# Patient Record
Sex: Female | Born: 1955 | Race: White | Hispanic: No | Smoking: Former smoker
Health system: Southern US, Community
[De-identification: ages and names within clinical notes are randomized; demographics above are authoritative.]

## PROBLEM LIST (undated history)

## (undated) DIAGNOSIS — E8801 Alpha-1-antitrypsin deficiency: Secondary | ICD-10-CM

## (undated) DIAGNOSIS — Z9889 Other specified postprocedural states: Secondary | ICD-10-CM

## (undated) DIAGNOSIS — I700 Atherosclerosis of aorta: Secondary | ICD-10-CM

## (undated) DIAGNOSIS — J302 Other seasonal allergic rhinitis: Secondary | ICD-10-CM

## (undated) DIAGNOSIS — G43909 Migraine, unspecified, not intractable, without status migrainosus: Secondary | ICD-10-CM

## (undated) DIAGNOSIS — K219 Gastro-esophageal reflux disease without esophagitis: Secondary | ICD-10-CM

## (undated) DIAGNOSIS — G4700 Insomnia, unspecified: Secondary | ICD-10-CM

## (undated) DIAGNOSIS — R112 Nausea with vomiting, unspecified: Secondary | ICD-10-CM

## (undated) DIAGNOSIS — K5792 Diverticulitis of intestine, part unspecified, without perforation or abscess without bleeding: Secondary | ICD-10-CM

## (undated) DIAGNOSIS — D539 Nutritional anemia, unspecified: Secondary | ICD-10-CM

## (undated) DIAGNOSIS — E039 Hypothyroidism, unspecified: Secondary | ICD-10-CM

## (undated) HISTORY — PX: BREAST ENHANCEMENT SURGERY: SHX7

## (undated) HISTORY — PX: COLONOSCOPY: SHX174

---

## 1978-09-27 HISTORY — PX: ABDOMINAL HYSTERECTOMY: SHX81

## 1987-09-28 HISTORY — PX: ABDOMINOPLASTY: SUR9

## 2004-06-19 ENCOUNTER — Ambulatory Visit (HOSPITAL_COMMUNITY): Admission: RE | Admit: 2004-06-19 | Discharge: 2004-06-19 | Payer: Self-pay | Admitting: Allergy and Immunology

## 2005-08-30 ENCOUNTER — Other Ambulatory Visit: Admission: RE | Admit: 2005-08-30 | Discharge: 2005-08-30 | Payer: Self-pay | Admitting: Obstetrics and Gynecology

## 2010-10-05 ENCOUNTER — Encounter
Admission: RE | Admit: 2010-10-05 | Discharge: 2010-10-05 | Payer: Self-pay | Source: Home / Self Care | Attending: Family Medicine | Admitting: Family Medicine

## 2010-10-05 IMAGING — US US SOFT TISSUE HEAD/NECK
1 series · 11 of 11 positions shown · non-contrast
Comparison: None.

CLINICAL DATA: Nodule.

ULTRASOUND OF HEAD/NECK SOFT TISSUES
TECHNIQUE: Ultrasound examination of the head and neck soft
tissues was performed in the area of clinical concern.

[Series 1: us soft tissue head/neck · 0.06mm/px · 11 of 11 slices shown]
[im 1/11]
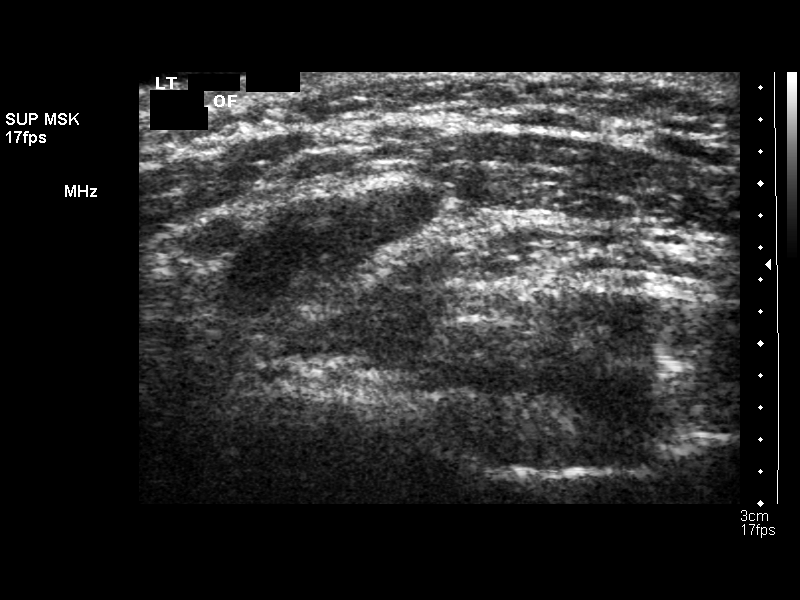
[im 2/11]
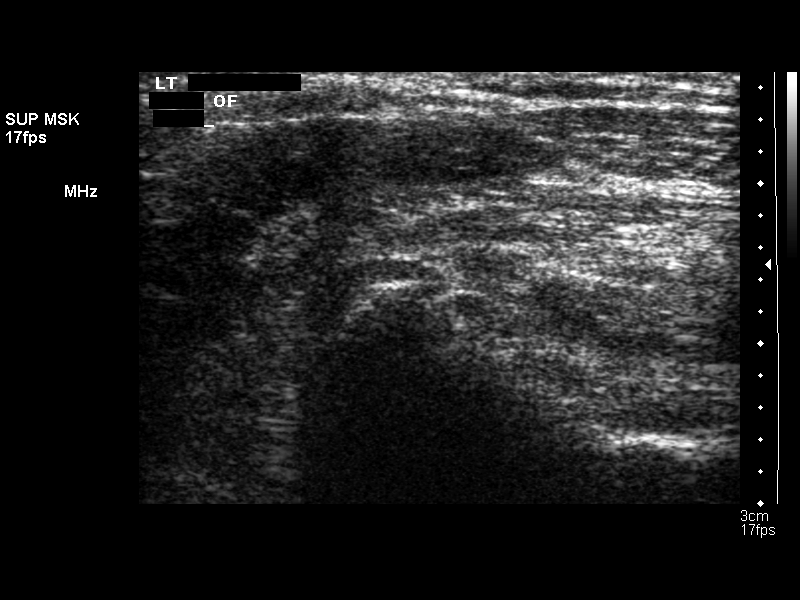
[im 3/11]
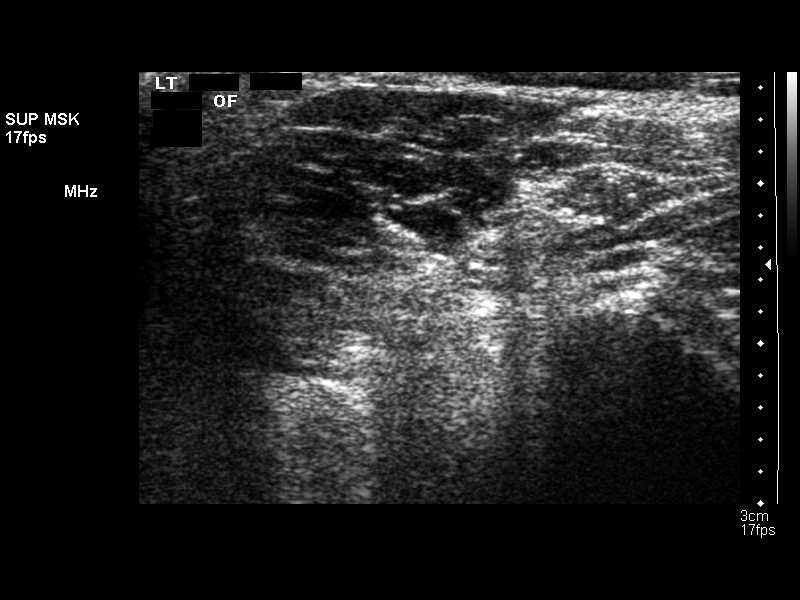
[im 4/11]
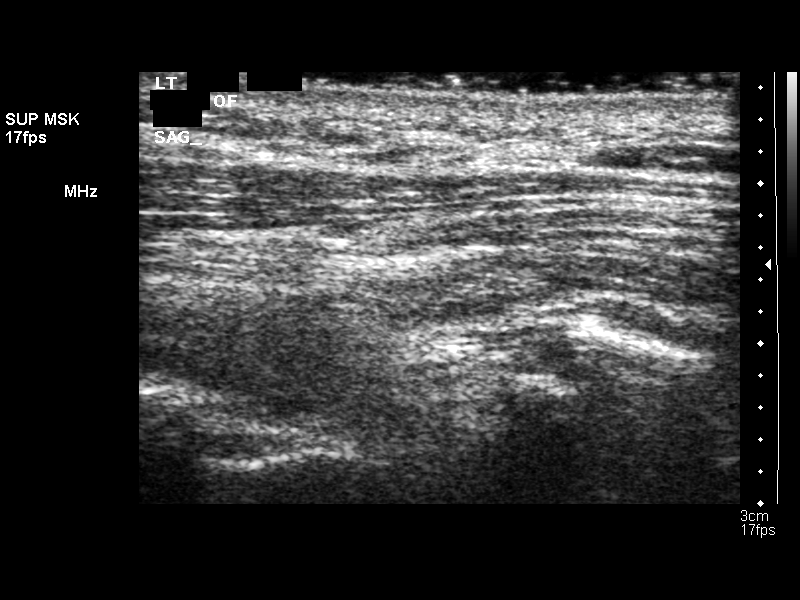
[im 5/11]
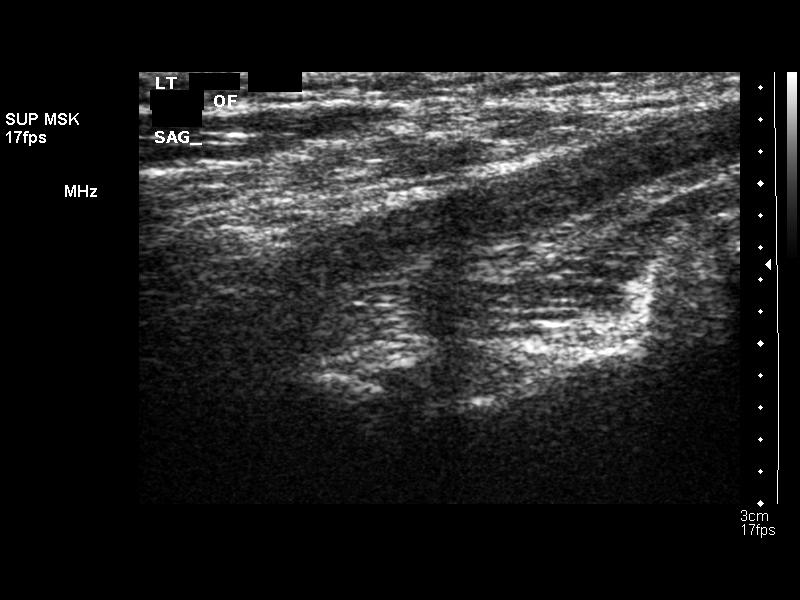
[im 6/11]
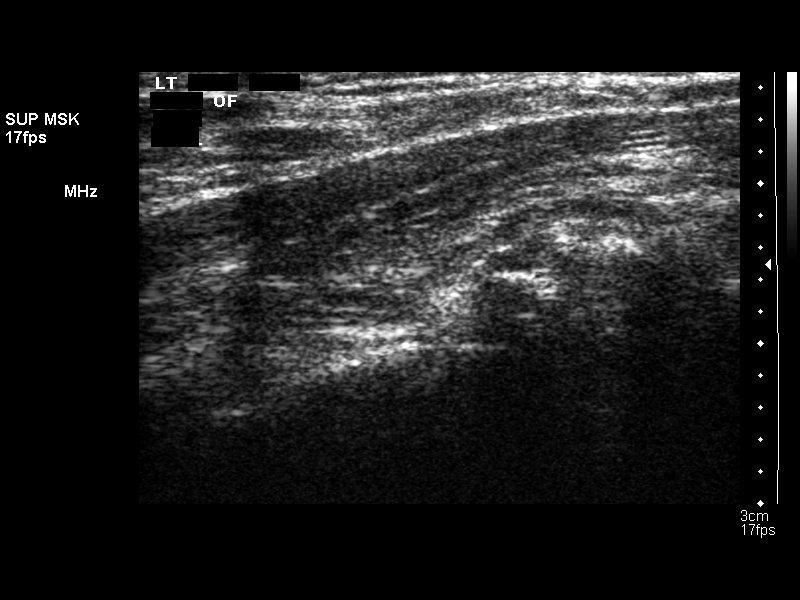
[im 7/11]
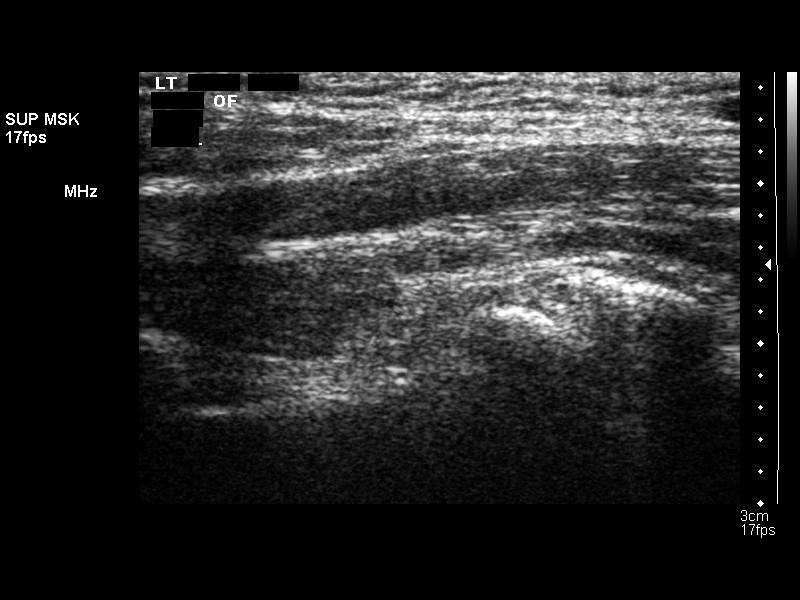
[im 8/11]
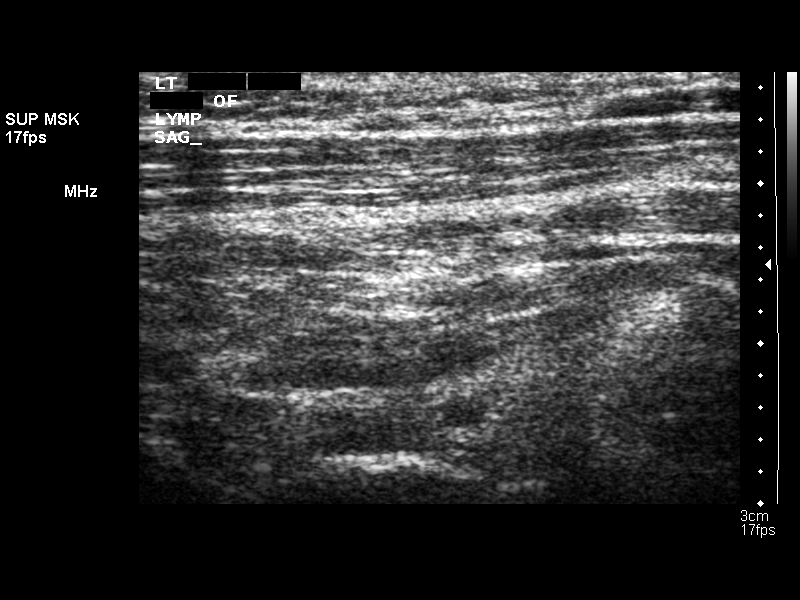
[im 9/11]
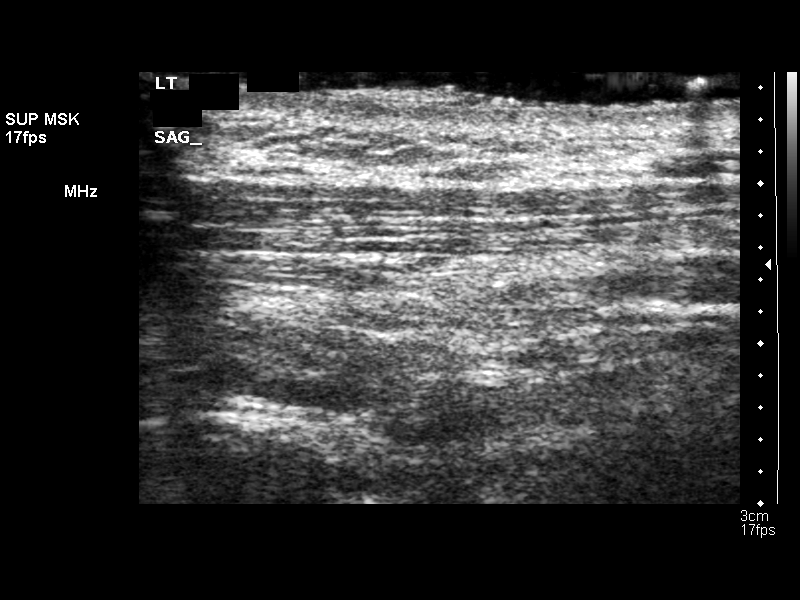
[im 10/11]
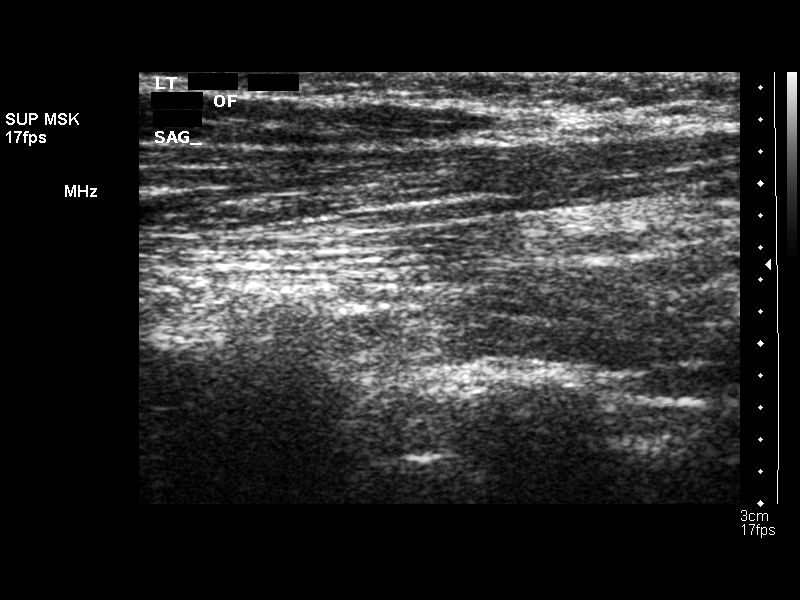
[im 11/11]
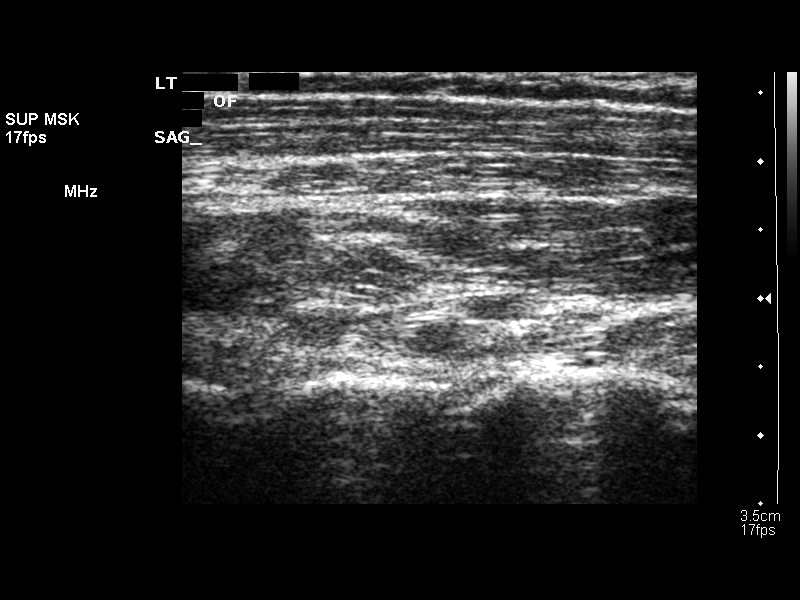

[11 of 11 positions shown; findings below may reference images not displayed]

FINDINGS: A faint nodular density is palpable deep in the left
posterior neck adjacent the paraspinous musculature.  There is some
shadowing, but no discrete mass evident.  No significant asymmetry
is present from the right side.
IMPRESSION: No discrete lesion identified over the palpable area.  The patient
states the area is sometimes more prominent.  CT of the neck could
be used for further evaluation if clinically indicated, preferably
when the area is more inflamed.

## 2010-10-22 ENCOUNTER — Ambulatory Visit (HOSPITAL_COMMUNITY)
Admission: RE | Admit: 2010-10-22 | Discharge: 2010-10-22 | Payer: Self-pay | Source: Home / Self Care | Attending: Gastroenterology | Admitting: Gastroenterology

## 2010-10-23 ENCOUNTER — Other Ambulatory Visit: Payer: Self-pay | Admitting: Family Medicine

## 2010-10-23 DIAGNOSIS — Z1239 Encounter for other screening for malignant neoplasm of breast: Secondary | ICD-10-CM

## 2010-10-30 ENCOUNTER — Ambulatory Visit
Admission: RE | Admit: 2010-10-30 | Discharge: 2010-10-30 | Disposition: A | Discharge status: Home / Self Care | Payer: BC Managed Care – PPO | Source: Ambulatory Visit | Attending: *Deleted | Admitting: *Deleted

## 2010-10-30 DIAGNOSIS — Z1239 Encounter for other screening for malignant neoplasm of breast: Secondary | ICD-10-CM

## 2010-11-10 NOTE — Op Note (Signed)
  [name], [name]              ACCOUNT NO.:  [id number]  MEDICAL RECORD NO.:  [id number]          PATIENT TYPE:  AMB  LOCATION:  ENDO                         FACILITY:  [facility]  PHYSICIAN:  Danise Edge, M.D.   DATE OF BIRTH:  [birth year]  DATE OF PROCEDURE:  10/22/2010 DATE OF DISCHARGE:                              OPERATIVE REPORT   PROCEDURE:  Screening colonoscopy.  REFERRING PHYSICIAN:  Ancil Boozer, MD.  HISTORY:  Ms. Kiara Olson is a 55 year old female born on 1956-05-05.  The patient is scheduled to undergo her first screening colonoscopy with polypectomy to prevent colon cancer.  ENDOSCOPIST:  Danise Edge, M.D.  DESCRIPTION OF PROCEDURE:  After obtaining informed consent, the patient was placed in the left lateral decubitus position.  The patient received intravenous propofol conscious sedation delivery by Anesthesia.  Anal inspection and digital rectal exam were normal.  The Pentax pediatric colonoscope was introduced into the rectum and easily advanced to the cecum.  A normal-appearing ileocecal valve and appendiceal orifice were identified.  Colonic preparation for the exam today was good.  Rectum normal:  Retroflex view of the distal rectum normal.  Sigmoid colon and descending colon:  A few small diverticula were noted. Splenic flexure normal.  Transverse colon normal.  Hepatic flexure normal.  Ascending colon normal.  Cecum and ileocecal valve normal.  ASSESSMENT:  Normal screening proctocolonoscopy to the cecum.  A few diverticula are present in the left colon.  RECOMMENDATIONS:  Repeat screening colonoscopy in 10 years.          ______________________________ Danise Edge, M.D.     MJ/MEDQ  D:  10/22/2010  T:  10/22/2010  Job:  [id number]  cc:   Ancil Boozer, MD Fax: [phone]  Electronically Signed by Danise Edge M.D. on 11/10/2010 02:37:39 PM

## 2010-11-17 ENCOUNTER — Ambulatory Visit: Payer: Self-pay | Admitting: Pulmonary Disease

## 2011-01-18 ENCOUNTER — Other Ambulatory Visit: Payer: Self-pay | Admitting: Family Medicine

## 2011-01-18 DIAGNOSIS — Z1239 Encounter for other screening for malignant neoplasm of breast: Secondary | ICD-10-CM

## 2014-08-26 ENCOUNTER — Other Ambulatory Visit: Payer: Self-pay | Admitting: Family Medicine

## 2014-08-26 DIAGNOSIS — R748 Abnormal levels of other serum enzymes: Secondary | ICD-10-CM

## 2014-08-28 ENCOUNTER — Ambulatory Visit
Admission: RE | Admit: 2014-08-28 | Discharge: 2014-08-28 | Disposition: A | Discharge status: Home / Self Care | Payer: Managed Care, Other (non HMO) | Source: Ambulatory Visit | Attending: Family Medicine | Admitting: Family Medicine

## 2014-08-28 DIAGNOSIS — R748 Abnormal levels of other serum enzymes: Secondary | ICD-10-CM

## 2014-08-28 IMAGING — US US ABDOMEN LIMITED
1 series · 14 of 25 positions shown · non-contrast
Comparison: None.

CLINICAL DATA: Elevated hepatic function studies

EXAM:
US ABDOMEN LIMITED - RIGHT UPPER QUADRANT

[Series 1: us abdomen limited · 0.20mm/px · 14 of 53 slices shown]
[im 1/53]
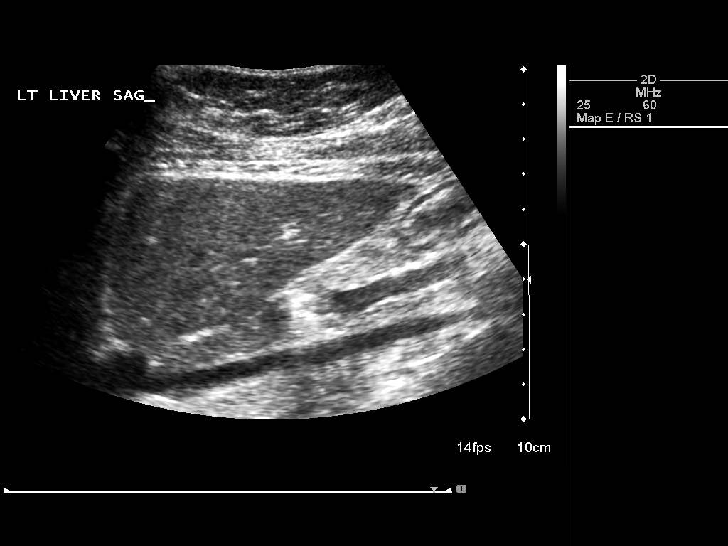
[im 5/53]
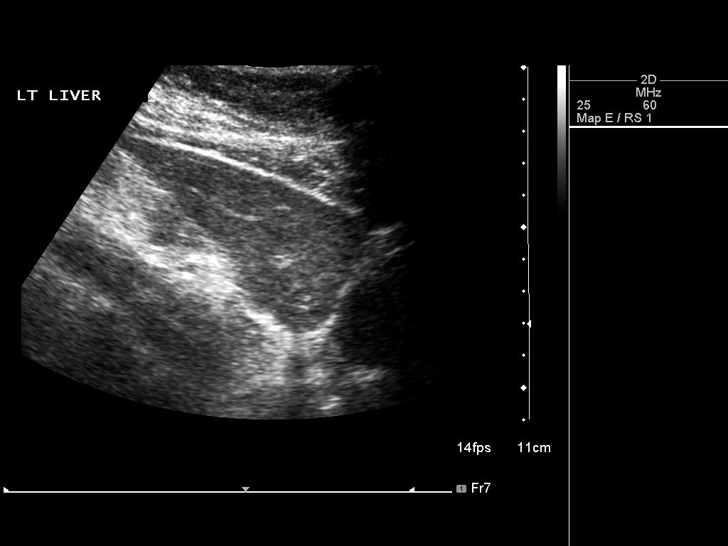
[im 9/53]
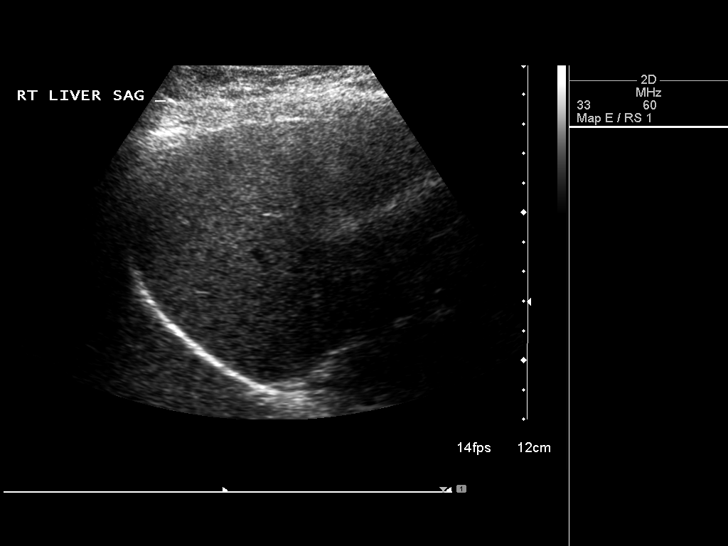
[im 14/53]
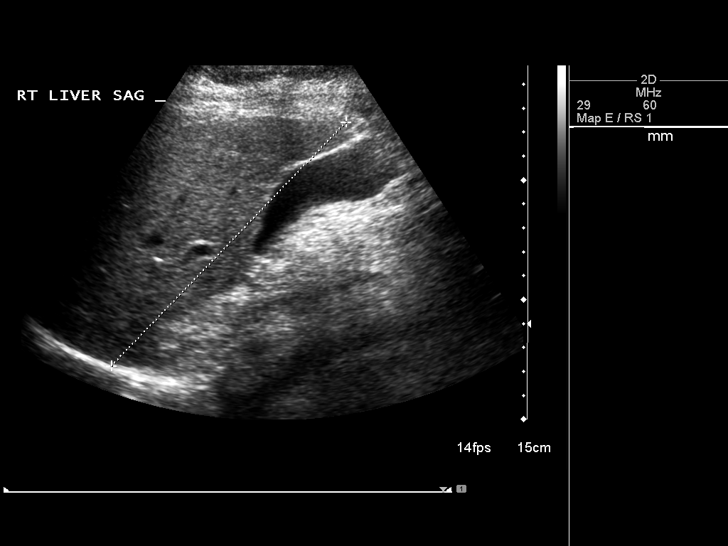
[im 18/53]
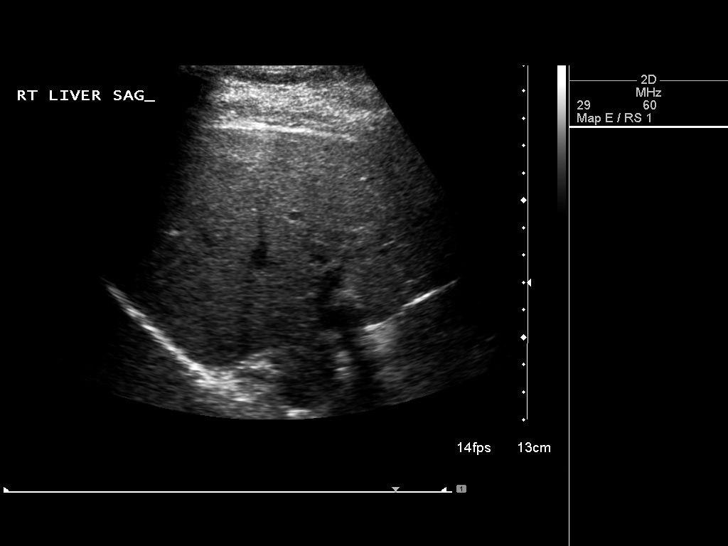
[im 20/53]
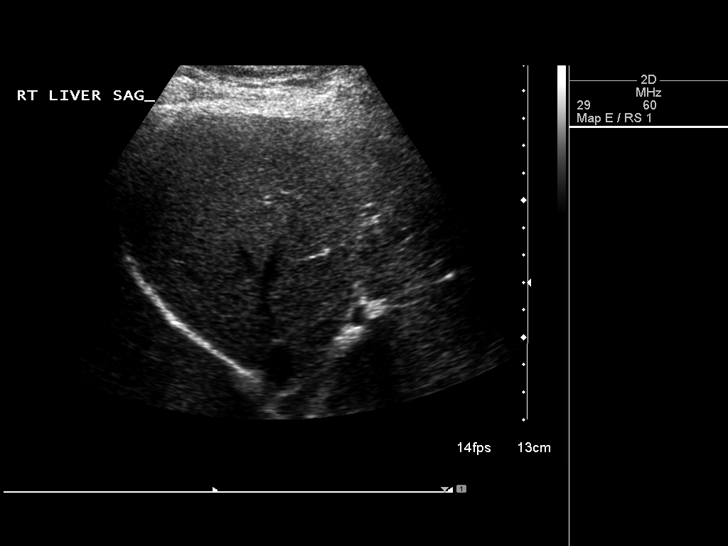
[im 24/53]
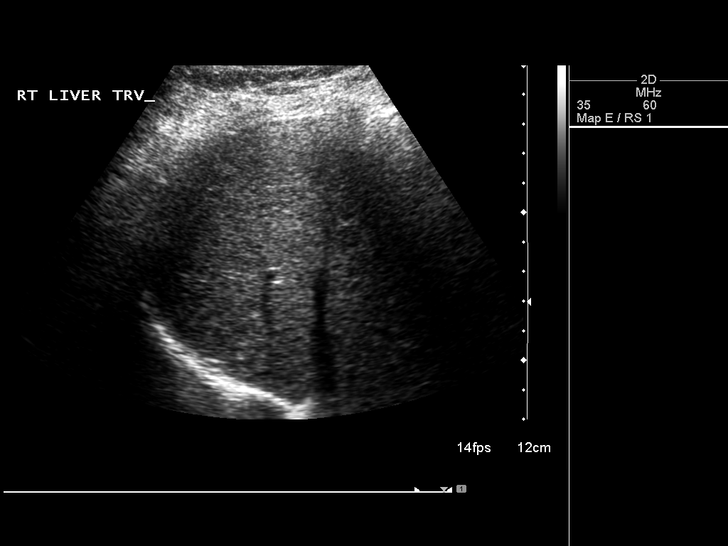
[im 29/53]
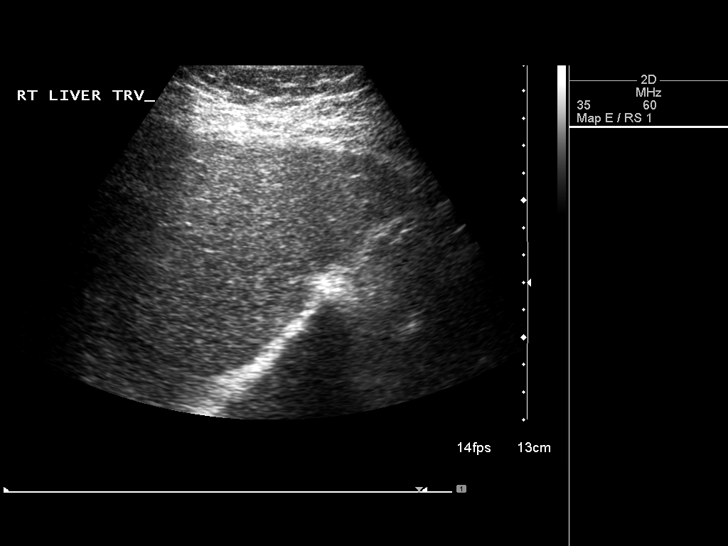
[im 33/53]
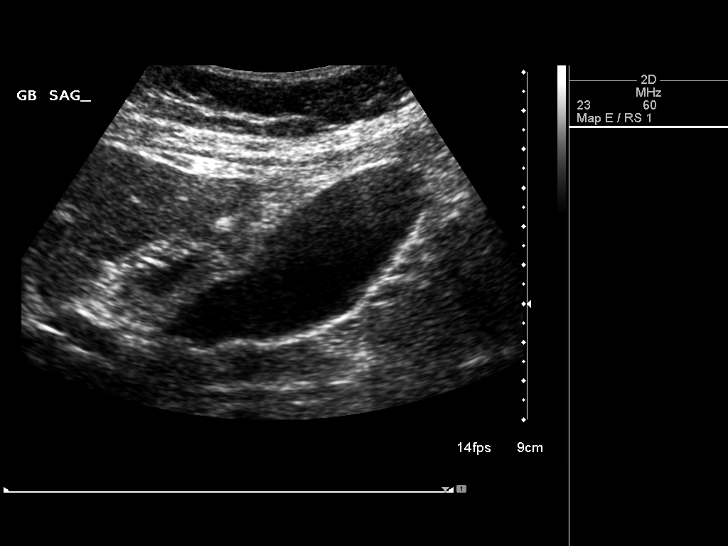
[im 35/53]
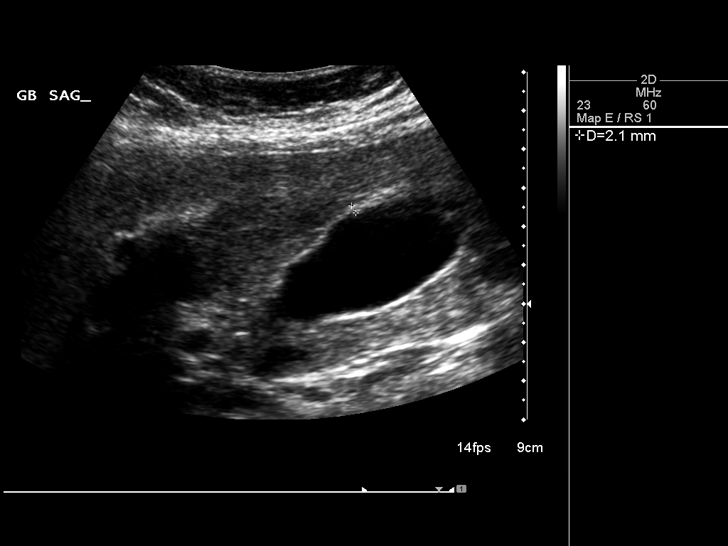
[im 40/53]
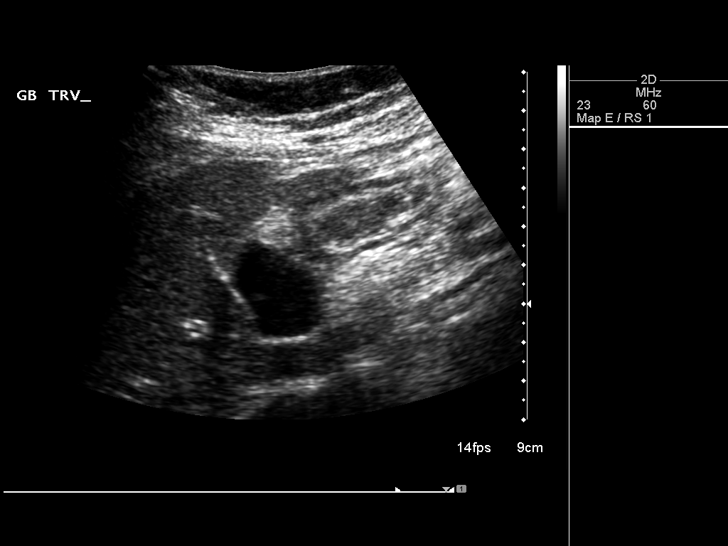
[im 44/53]
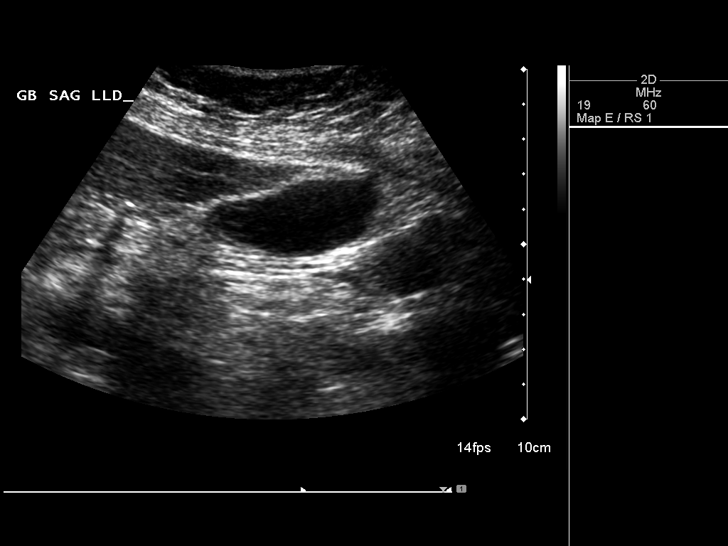
[im 48/53]
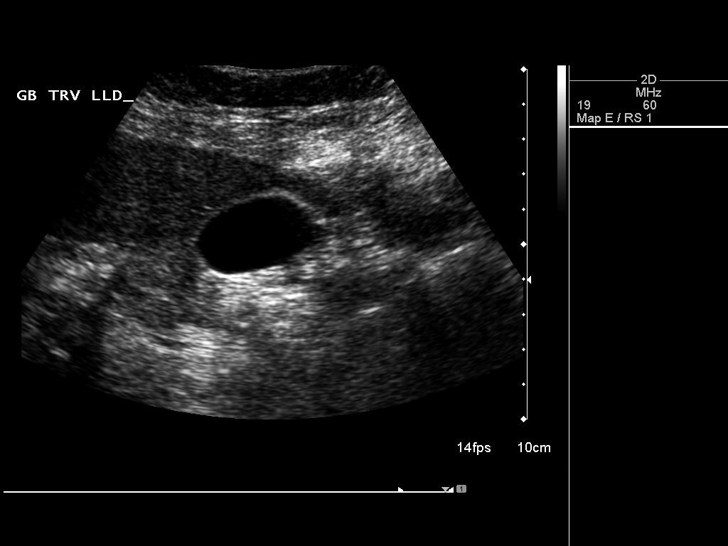
[im 53/53]
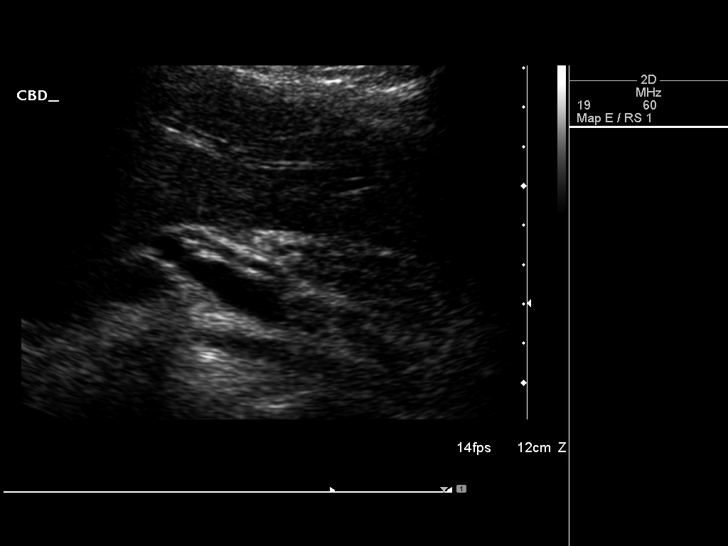

[14 of 25 positions shown; findings below may reference images not displayed]

FINDINGS: Gallbladder:

No gallstones or wall thickening visualized. No sonographic Murphy
sign noted.

Common bile duct:

Diameter: 3.4 mm

Liver:

No focal lesion identified. There is no intrahepatic ductal
dilation. There is no focal mass.
IMPRESSION: Normal limited right upper quadrant abdominal ultrasound.

## 2016-09-27 DIAGNOSIS — K5792 Diverticulitis of intestine, part unspecified, without perforation or abscess without bleeding: Secondary | ICD-10-CM

## 2016-09-27 DIAGNOSIS — I700 Atherosclerosis of aorta: Secondary | ICD-10-CM

## 2016-09-27 HISTORY — DX: Diverticulitis of intestine, part unspecified, without perforation or abscess without bleeding: K57.92

## 2016-09-27 HISTORY — DX: Atherosclerosis of aorta: I70.0

## 2017-08-03 ENCOUNTER — Other Ambulatory Visit: Payer: Self-pay | Admitting: Family Medicine

## 2017-08-03 DIAGNOSIS — K5791 Diverticulosis of intestine, part unspecified, without perforation or abscess with bleeding: Secondary | ICD-10-CM

## 2017-08-04 ENCOUNTER — Ambulatory Visit
Admission: RE | Admit: 2017-08-04 | Discharge: 2017-08-04 | Disposition: A | Discharge status: Home / Self Care | Payer: Managed Care, Other (non HMO) | Source: Ambulatory Visit | Attending: Family Medicine | Admitting: Family Medicine

## 2017-08-04 DIAGNOSIS — K5791 Diverticulosis of intestine, part unspecified, without perforation or abscess with bleeding: Secondary | ICD-10-CM

## 2017-08-04 MED ORDER — IOPAMIDOL (ISOVUE-300) INJECTION 61%
100.0000 mL | Freq: Once | INTRAVENOUS | Status: AC | PRN
  Administered 2017-08-04: 100 mL via INTRAVENOUS

## 2017-08-05 IMAGING — CT CT ABD-PELV W/ CM
3 of 5 series · 12 of 36 positions shown, 18 images · IV contrast (READICAT/WATER & [ID] ISOVUE 300)
Comparison: None.

CLINICAL DATA: 61-year-old female with a history of diverticulitis.
Evaluate for underlying malignancy.

EXAM:
CT ABDOMEN AND PELVIS WITH CONTRAST
TECHNIQUE: Multidetector CT imaging of the abdomen and pelvis was performed
using the standard protocol following bolus administration of
intravenous contrast.
CONTRAST:  100mL [NH] IOPAMIDOL ([NH]) INJECTION 61%

[Series 3: abd/pelvis with · axial · 0.65mm/px · z∈[-356,-56]mm · 8 of 78 slices shown, 13 images]
[im 9/78  soft-tissue]
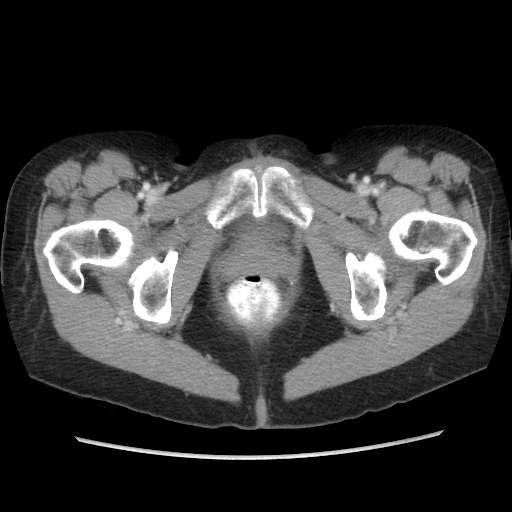
[im 9/78  bone]
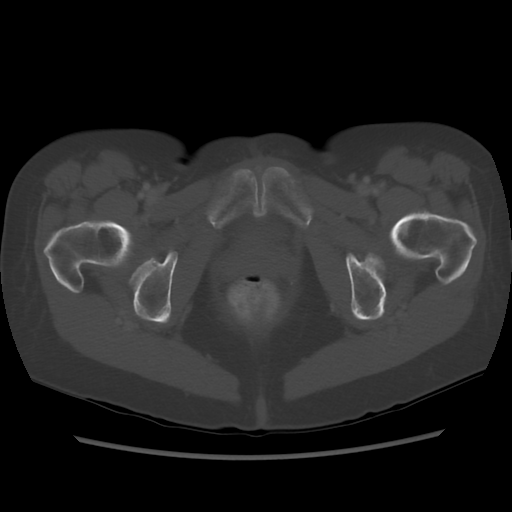
[im 18/78  soft-tissue]
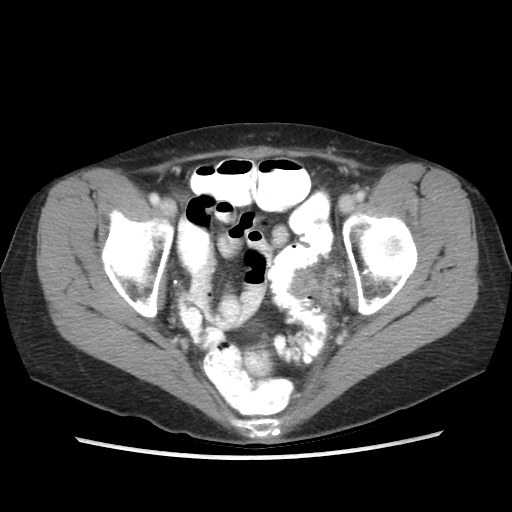
[im 26/78  soft-tissue]
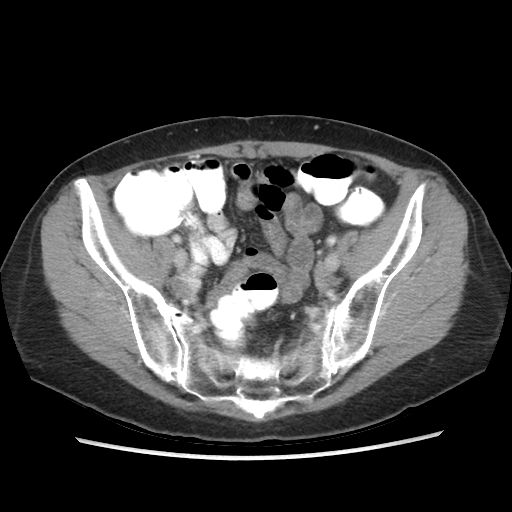
[im 35/78  soft-tissue]
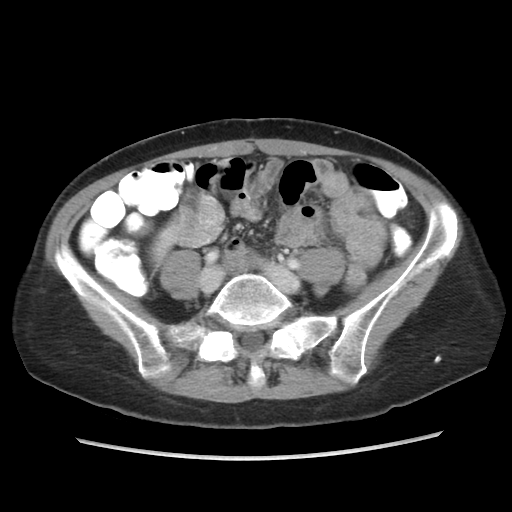
[im 43/78  soft-tissue]
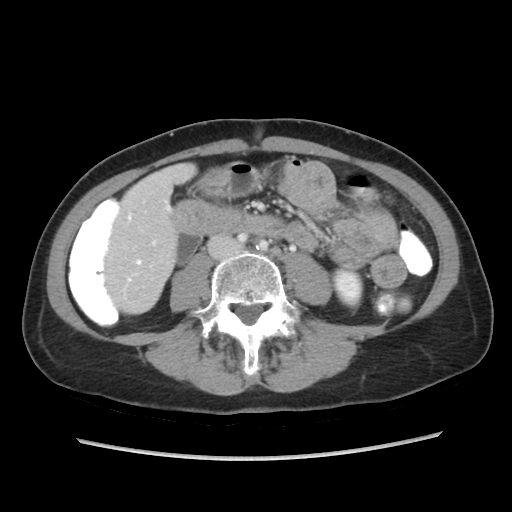
[im 43/78  lung]
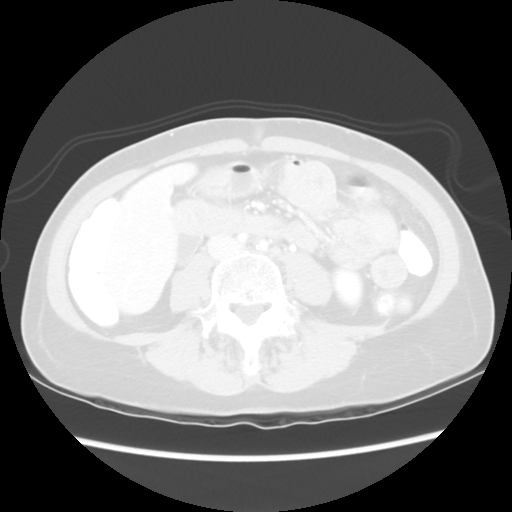
[im 52/78  soft-tissue]
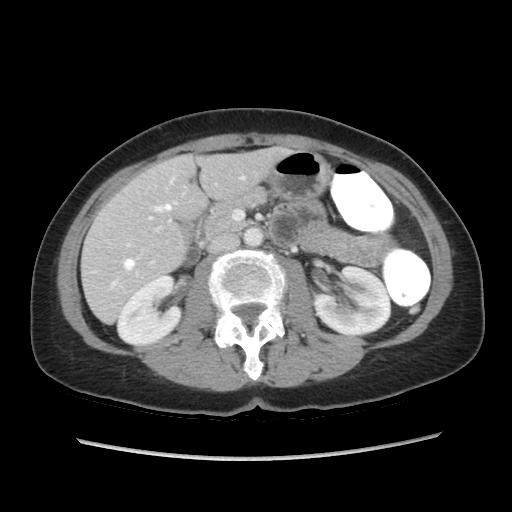
[im 52/78  lung]
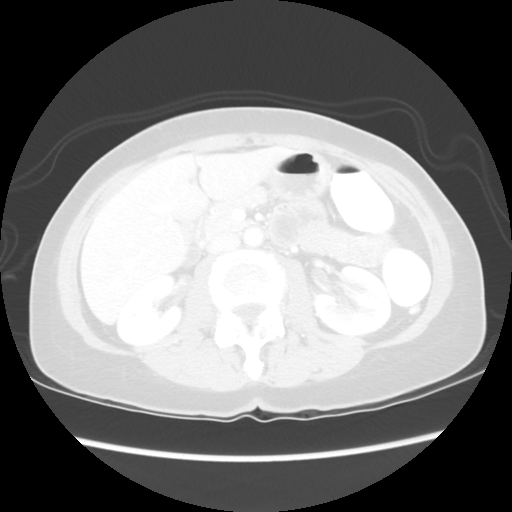
[im 60/78  soft-tissue]
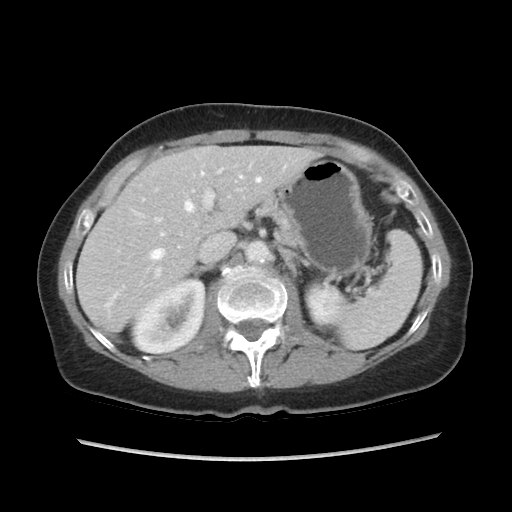
[im 60/78  lung]
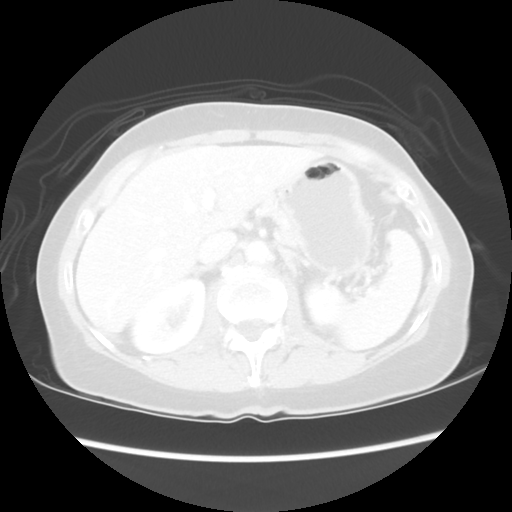
[im 69/78  soft-tissue]
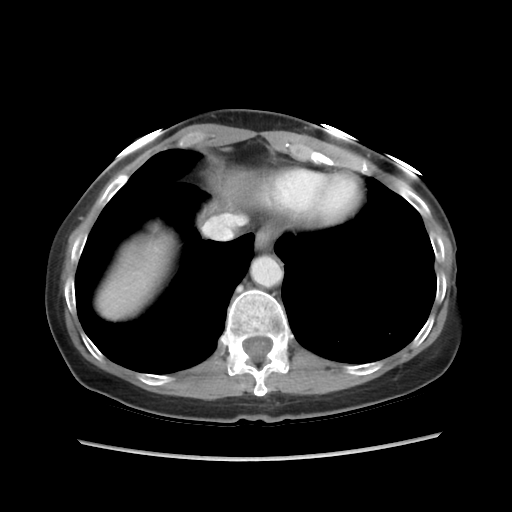
[im 69/78  lung]
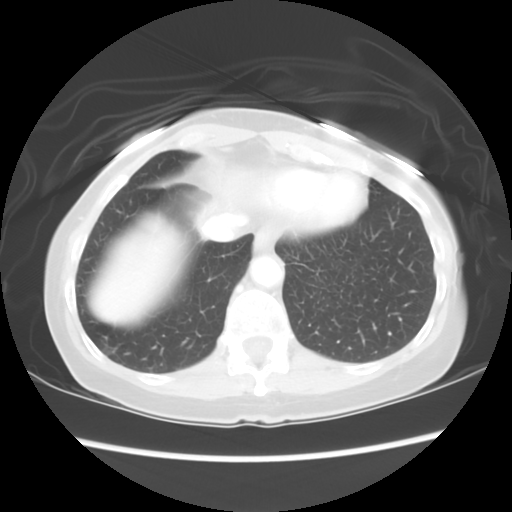

[Series 601: coronal body · coronal · 0.82mm/px · 1 of 106 slices shown, 2 images]
[im 36/106  soft-tissue]
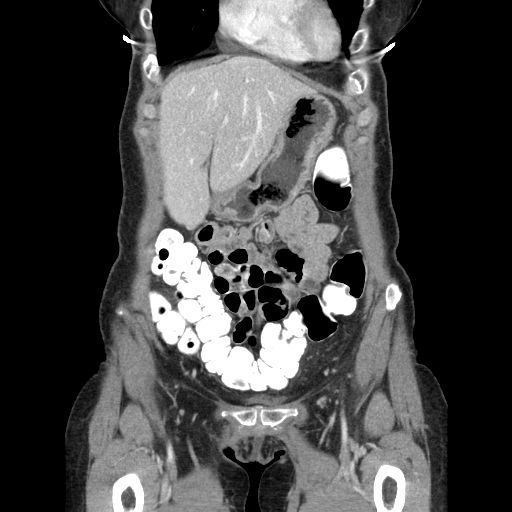
[im 36/106  bone]
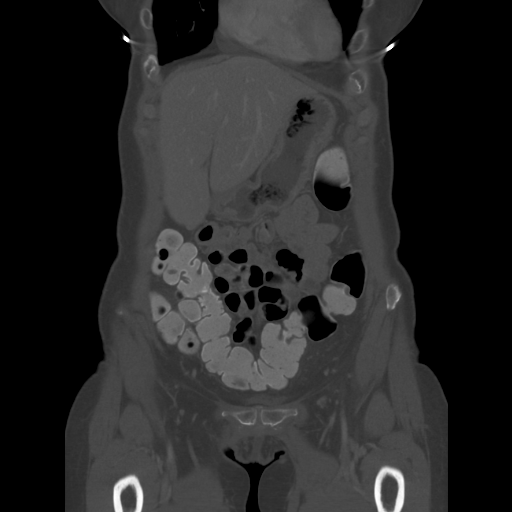

[Series 602: sagittal body · sagittal · 0.82mm/px · 3 of 134 slices shown]
[im 9/134  soft-tissue]
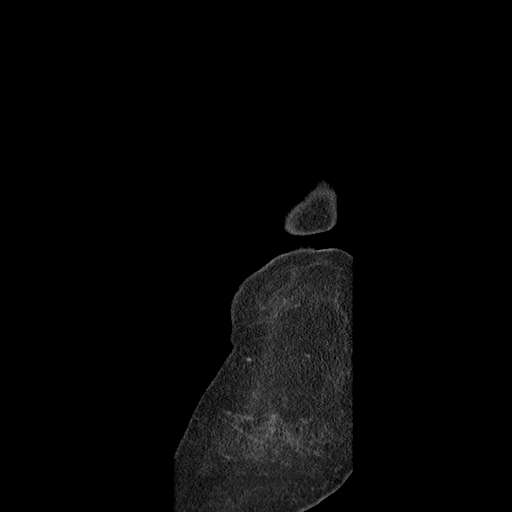
[im 27/134  soft-tissue]
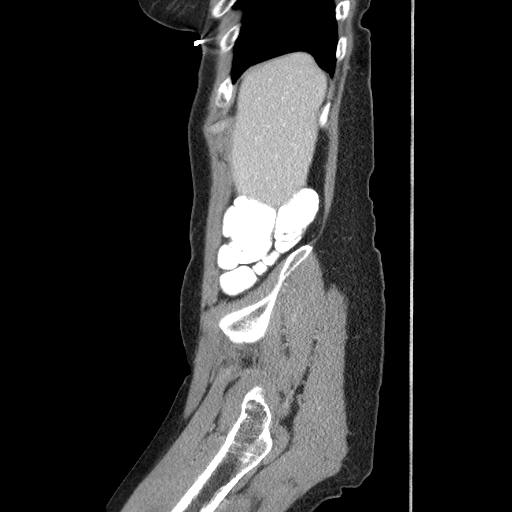
[im 45/134  soft-tissue]
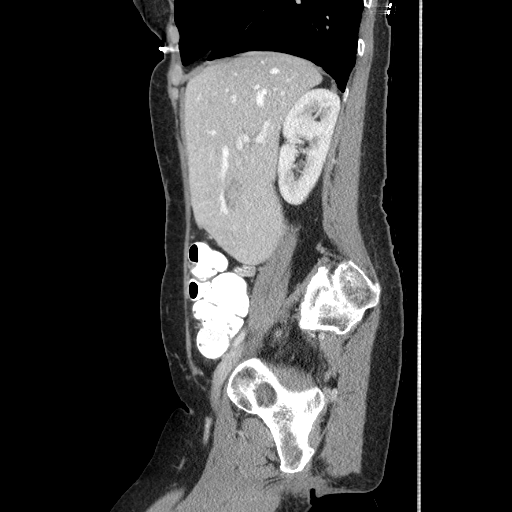

[12 of 36 positions shown; findings below may reference images not displayed]

FINDINGS: Lower chest: The lung bases are clear. Visualized cardiac structures
are within normal limits for size. No pericardial effusion.
Unremarkable visualized distal thoracic esophagus.

Hepatobiliary: Normal hepatic contour and morphology. No discrete
hepatic lesions. Normal appearance of the gallbladder. No intra or
extrahepatic biliary ductal dilatation.

Pancreas: Unremarkable. No pancreatic ductal dilatation or
surrounding inflammatory changes.

Spleen: Normal in size without focal abnormality.

Adrenals/Urinary Tract: Adrenal glands are unremarkable. Kidneys are
normal, without renal calculi, focal lesion, or hydronephrosis.
Bladder is unremarkable.

Stomach/Bowel: Sigmoid diverticulosis. There are a few regions of
relative wall thickening along the mesenteric an anti mesenteric
borders of the sigmoid colon. At the more proximal site of focal
wall thickening, there is associated inflammatory stranding in the
pericolonic fat consistent with acute diverticulitis. No discrete
fluid collection to suggest abscess formation. Normal appearance of
the terminal ileum. No evidence of bowel obstruction. No suspicious
lymphadenopathy.

Vascular/Lymphatic: Atherosclerotic vascular calcifications along
the abdominal aorta. No evidence of aneurysm. No suspicious
lymphadenopathy.

Reproductive: Status post hysterectomy. No adnexal masses.

Other: No abdominal wall hernia or abnormality. No abdominopelvic
ascites.

Musculoskeletal: No acute fracture or aggressive appearing lytic or
blastic osseous lesion. Mild lower lumbar degenerative disc disease
and facet arthropathy.
IMPRESSION: 1. CT findings are most suggestive of acute uncomplicated sigmoid
diverticulitis. Please note that conventional CT is relatively
insensitive for the detection of colonic neoplasm. Recommend further
evaluation with colonoscopy following resolution of the patient's
acute symptoms.
2.  Aortic Atherosclerosis ([NH]-170.0)

## 2018-03-27 ENCOUNTER — Ambulatory Visit: Payer: Self-pay | Admitting: Surgery

## 2018-03-27 NOTE — H&P (Signed)
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 Since 2011 she has had approximately 6-8 "attacks" of diverticulitis which have required antibiotics. None of these attacks have had complications requiring percutaneous drainage/abscess have been managed nonoperatively. She thinks she's had a couple more episodes which she is managed on her own at home. She underwent colonoscopy 08/2017 with Dr. Therisa Doyne and was found to have a benign appearing stricture approximately 15cm from the anal verge which was traversed with a pediatric scope. Pertinent findings include diverticulosis but an otherwise normal exam. She was subsequent referred to [place] for follow-up. Since her colonoscopy she denies any complaints. She denies any obstructive symptoms and has formed bowel movements daily. She denies history of obstructive symptoms as well. Rarely she will have some issues with constipation that is alleviated with a single dose of milk of magnesia. She is taking daily Benefiber.  She had a CAT scan of her abdomen and pelvis 07/2017 which demonstrated findings consistent with acute uncomplicated sigmoid diverticulitis.  PMH: Hypothyroidism (well-controlled on Armour Thyroid) PSH: Hysterectomy via low midline incision in the 1980s; abdominoplasty 1989 FHx: Sister had breast cancer 2 years ago. Patient states her last mammogram was 2-3 years ago and she has no plans for any further mammograms due to discomfort and has elected to perform self exams Social: Denies use of tobacco/EtOH/drugs ROS: A comprehensive 10 system review of systems was completed with the patient and pertinent findings as noted above.  The patient is a 62 year old female.   Past Surgical History Alean Rinne, [place]; 10/18/2017 10:39 AM) Breast Augmentation  Bilateral. Hysterectomy (not due to cancer) - Complete   Hysterectomy (not due to cancer) - Partial  Oral Surgery   Diagnostic Studies History Alean Rinne, [place]; 10/18/2017 10:39 AM) Colonoscopy  within last year Mammogram  >3 years ago Pap Smear  >5 years ago  Allergies Alean Rinne, RMA; 10/18/2017 10:41 AM) Local Anesthetics (Ester - Lidocaine)  Allergies Reconciled   Medication History Alean Rinne, RMA; 10/18/2017 10:42 AM) LORazepam (1MG  Tablet, Oral) Active. Armour Thyroid (30MG  Tablet, Oral) Active. Vitamin B12 (3000MCG/ML Liquid, Sublingual) Active. Medications Reconciled  Social History Alean Rinne, [place]; 10/18/2017 10:39 AM) Alcohol use  Occasional alcohol use. Caffeine use  Coffee. No drug use  Tobacco use  Former smoker.  Family History Alean Rinne, [place]; 10/18/2017 10:39 AM) Arthritis  Father, Mother. Breast Cancer  Sister. Diabetes Mellitus  Sister. Hypertension  Mother, Sister.  Pregnancy / Birth History Alean Rinne, [place]; 10/18/2017 10:39 AM) Age at menarche  77 years. Age of menopause  78-55 Gravida  5 Maternal age  66-20 Para  70  Other Problems Alean Rinne, [place]; 10/18/2017 10:39 AM) Back Pain  Depression  Diverticulosis  Gastroesophageal Reflux Disease  Thyroid Disease     Review of Systems Alean Rinne RMA; 10/18/2017 10:39 AM) General Not Present- Appetite Loss, Chills, Fatigue, Fever, Night Sweats, Weight Gain and Weight Loss. Skin Not Present- Change in Wart/Mole, Dryness, Hives, Jaundice, New Lesions, Non-Healing Wounds, Rash and Ulcer. HEENT Present- Wears glasses/contact lenses. Not Present- Earache, Hearing Loss, Hoarseness, Nose Bleed, Oral Ulcers, Ringing in the Ears, Seasonal Allergies, Sinus Pain, Sore Throat, Visual Disturbances and Yellow Eyes. Respiratory Not Present- Bloody sputum, Chronic Cough, Difficulty Breathing, Snoring and Wheezing. Breast Not Present- Breast Mass, Breast Pain, Nipple Discharge and Skin Changes. Cardiovascular Not Present-  Chest Pain, Difficulty Breathing Lying Down, Leg Cramps, Palpitations, Rapid  Heart Rate, Shortness of Breath and Swelling of Extremities. Gastrointestinal Present- Bloating, Change in Bowel Habits and Excessive gas. Not Present- Abdominal Pain, Bloody Stool, Chronic diarrhea, Constipation, Difficulty Swallowing, Gets full quickly at meals, Hemorrhoids, Indigestion, Nausea, Rectal Pain and Vomiting. Female Genitourinary Not Present- Frequency, Nocturia, Painful Urination, Pelvic Pain and Urgency. Musculoskeletal Present- Back Pain. Not Present- Joint Pain, Joint Stiffness, Muscle Pain, Muscle Weakness and Swelling of Extremities. Neurological Not Present- Decreased Memory, Fainting, Headaches, Numbness, Seizures, Tingling, Tremor, Trouble walking and Weakness. Psychiatric Present- Depression. Not Present- Anxiety, Bipolar, Change in Sleep Pattern, Fearful and Frequent crying. Endocrine Not Present- Cold Intolerance, Excessive Hunger, Hair Changes, Heat Intolerance, Hot flashes and New Diabetes. Hematology Not Present- Blood Thinners, Easy Bruising, Excessive bleeding, Gland problems, HIV and Persistent Infections.  Vitals Mardene Celeste King RMA; 10/18/2017 10:41 AM) 10/18/2017 10:40 AM Weight: 117.6 lb Height: 62in Body Surface Area: 1.53 m Body Mass Index: 21.51 kg/m  Temp.: 98.40F  Pulse: 98 (Regular)  BP: 110/70 (Sitting, Left Arm, Standard)   Assessment & Plan Harrell Gave M. Natajah Derderian MD; 10/18/2017 11:26 AM) DIVERTICULITIS (K57.92) Impression: Kiara Olson is a very pleasant 62yo female here for evaluation of recurrent attacks of uncompliacted sigmoid diverticulitis and an asymptomatic diverticular stricture -I discussed at length with the patient and her daughter the anatomy and physiology of the GI tract. We discussed the pathophysiology of diverticulitis and indications for surgery. We discussed that the frequency of attacks, when they're to the point of limiting quality of  life/lifestyle do represent an indication for surgery. Additionally we discussed that subsequent attacks in the setting of a diverticular stricture will likely lead to progression of the stricture which could result in obstructive symptoms and ultimately a large bowel obstruction. The timing of when this could occur is not known. -She is hesistent to proceed with surgery right now - she does not feel the frequency of attacks are limiting her at this time; she understands that the stricutre may progress over time -She would like additional time to think about everything which I think is reasonable. I have recommended she continue drinking 64 ounces of water per day and taking a daily fiber supplement (Benefiber, etc) indefinitely. I have provided her with a handout on colorectal surgery and diverticulitis. -She will call us back and let us know what her decision is. ER warnings were provided  Signed electronically by Ileana Roup, MD (10/18/2017 11:26 AM)

## 2018-04-05 ENCOUNTER — Other Ambulatory Visit: Payer: Self-pay | Admitting: Urology

## 2018-06-30 ENCOUNTER — Inpatient Hospital Stay: Admit: 2018-06-30 | Payer: Self-pay | Admitting: Surgery

## 2018-06-30 SURGERY — COLECTOMY, SIGMOID, LAPAROSCOPIC
Anesthesia: General

## 2018-10-23 IMAGING — US ULTRASOUND ABDOMEN LIMITED
1 series · 14 of 25 positions shown · non-contrast
Comparison: CT abdomen and pelvis [DATE]

CLINICAL DATA: Right upper quadrant pain

EXAM:
ULTRASOUND ABDOMEN LIMITED RIGHT UPPER QUADRANT

[Series 1: ultrasound abdomen limited · 0.20mm/px · 14 of 44 slices shown]
[im 1/44]
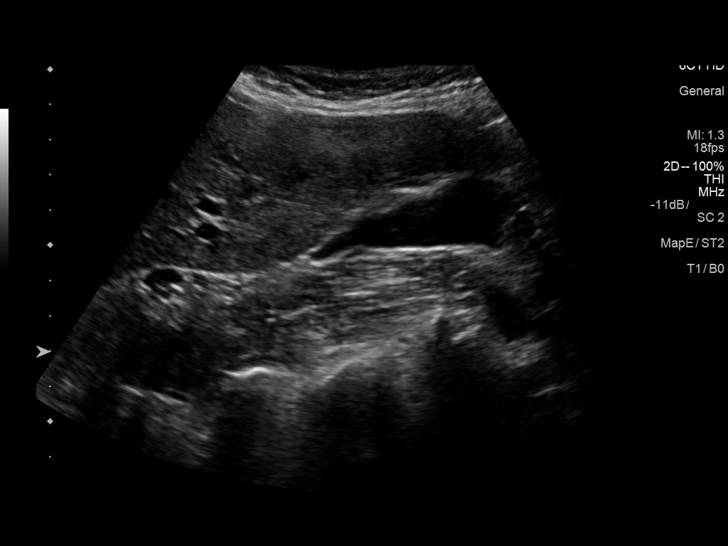
[im 4/44]
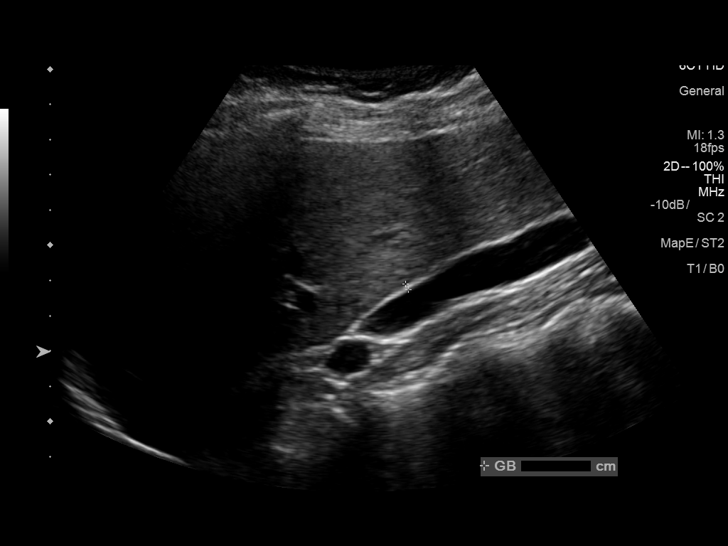
[im 8/44]
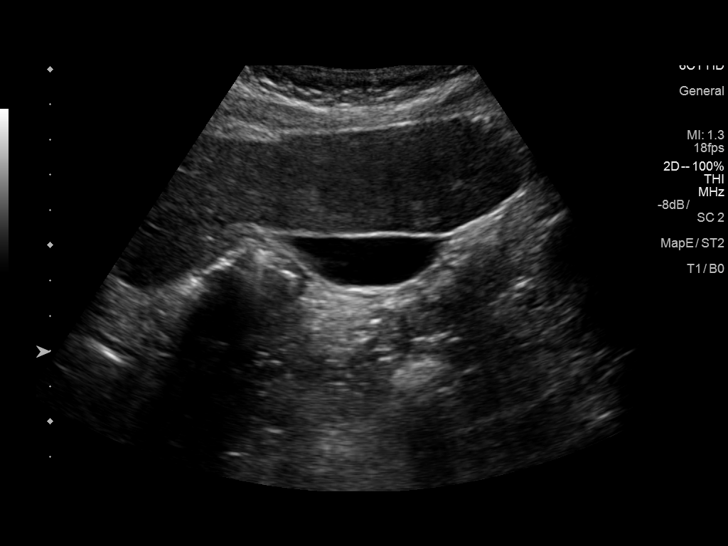
[im 11/44]
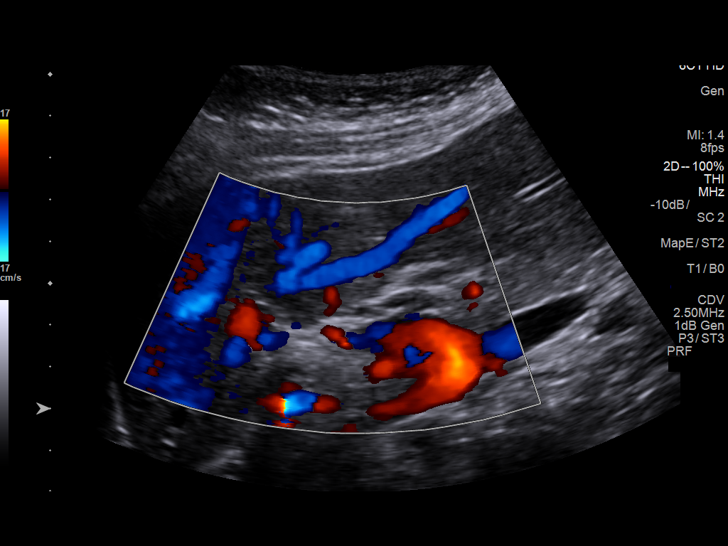
[im 15/44]
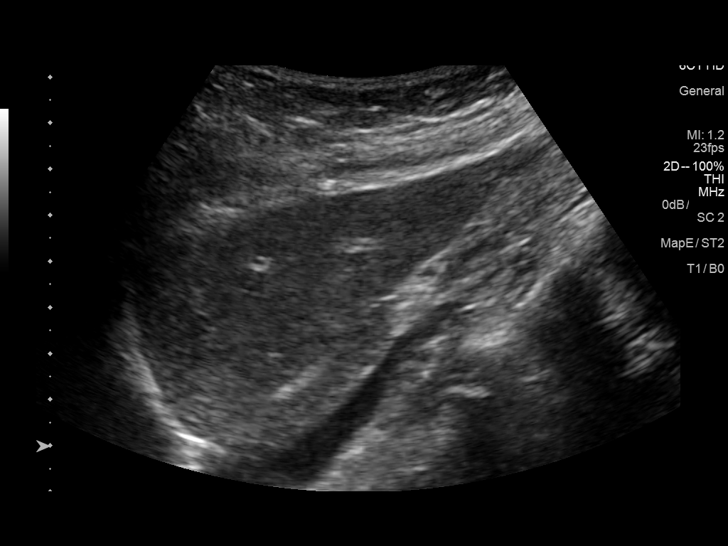
[im 17/44]
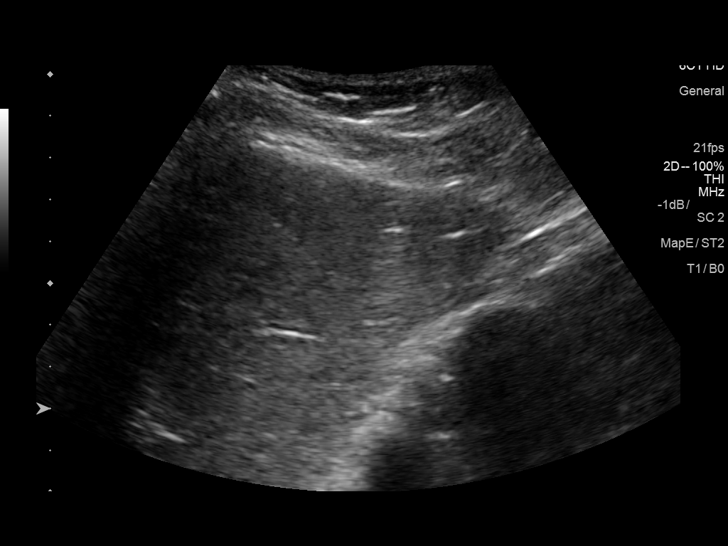
[im 20/44]
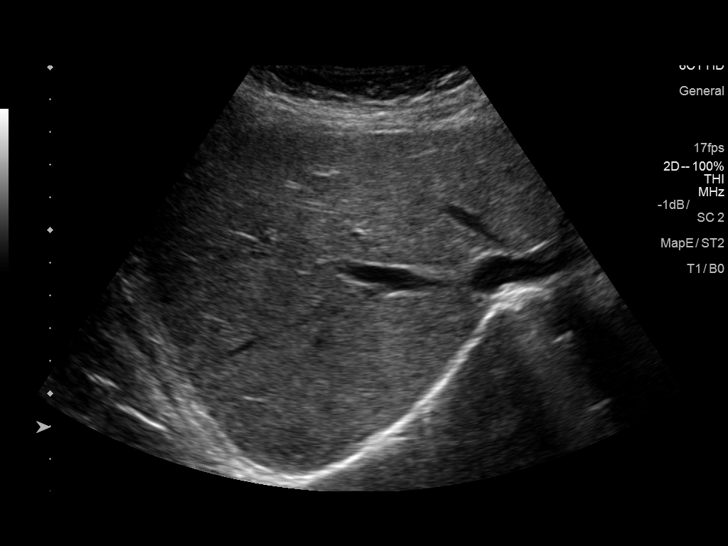
[im 24/44]
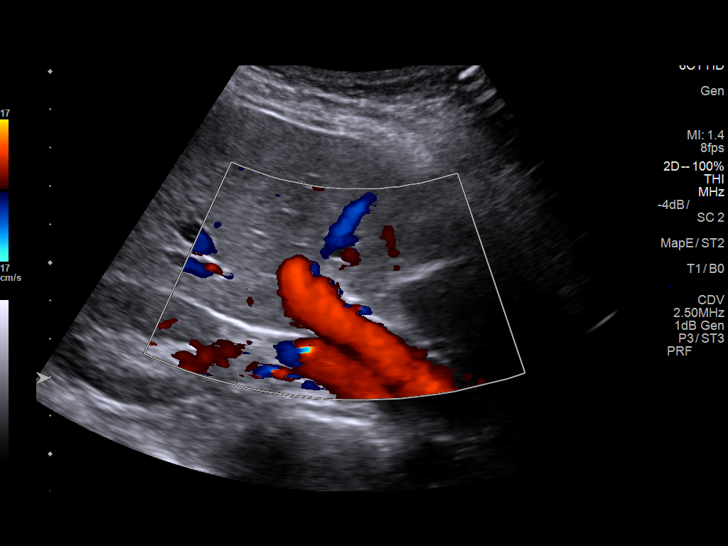
[im 27/44]
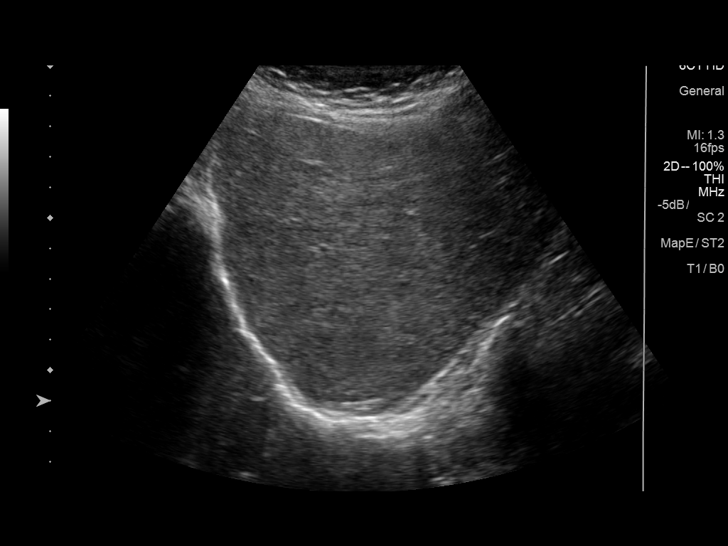
[im 29/44]
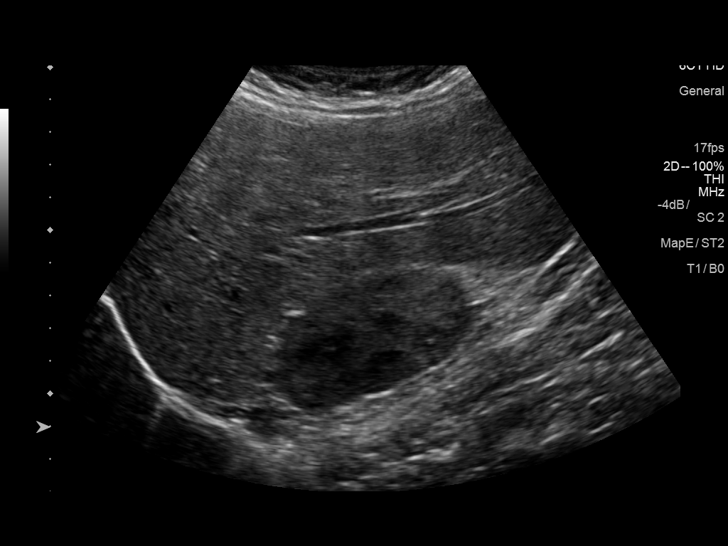
[im 33/44]
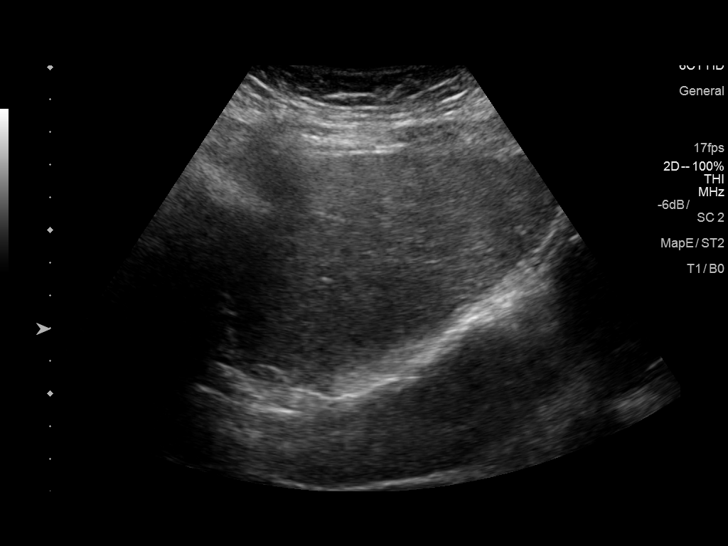
[im 36/44]
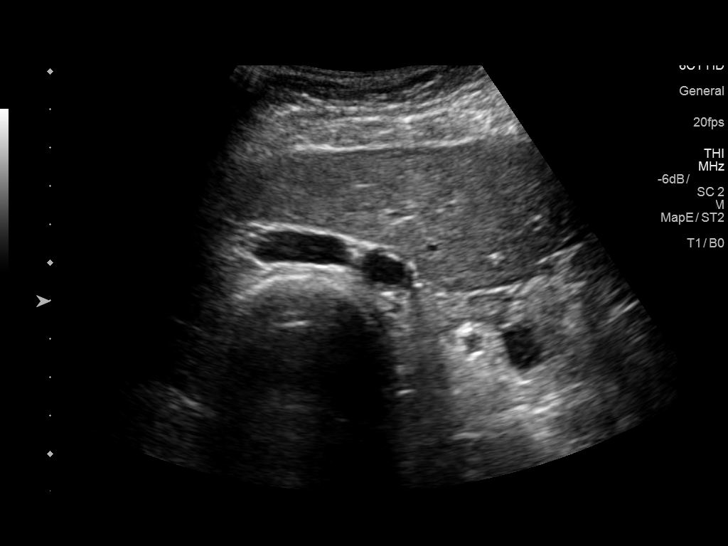
[im 40/44]
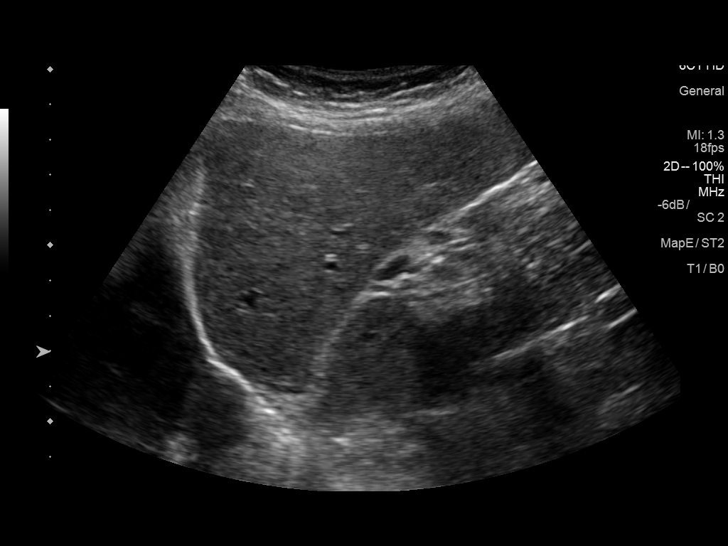
[im 44/44]
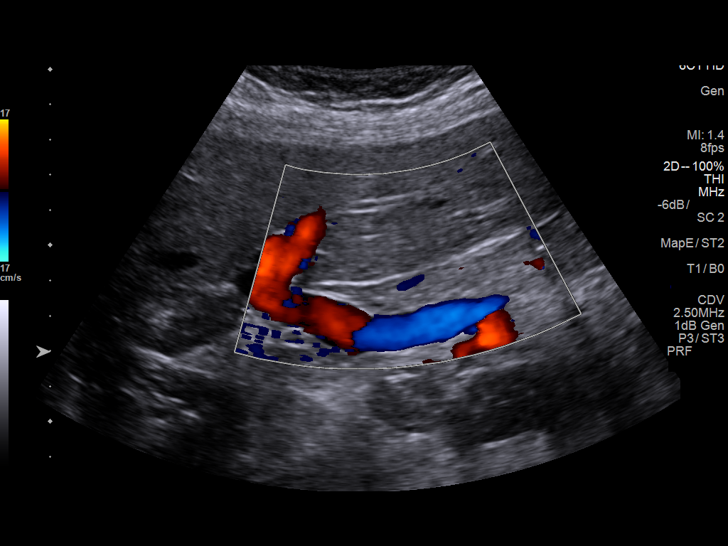

[14 of 25 positions shown; findings below may reference images not displayed]

FINDINGS: Gallbladder:

No gallstones or wall thickening visualized. There is no
pericholecystic fluid. No sonographic Murphy sign noted by
sonographer.

Common bile duct:

Diameter: 3 mm. No intrahepatic or extrahepatic biliary duct
dilatation.

Liver:

No focal lesion identified. Within normal limits in parenchymal
echogenicity. Portal vein is patent on color Doppler imaging with
normal direction of blood flow towards the liver.
IMPRESSION: Study within normal limits.

## 2018-10-31 DIAGNOSIS — K5792 Diverticulitis of intestine, part unspecified, without perforation or abscess without bleeding: Secondary | ICD-10-CM | POA: Diagnosis not present

## 2018-11-07 ENCOUNTER — Ambulatory Visit: Payer: Self-pay | Admitting: Surgery

## 2018-11-07 NOTE — H&P (Signed)
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 Since 2011 she has had approximately 6-8 "attacks" of diverticulitis which have required antibiotics. None of these attacks have had complications requiring percutaneous drainage/abscess have been managed nonoperatively. She thinks she's had a couple more episodes which she is managed on her own at home. She underwent colonoscopy 08/2017 with Dr. Therisa Doyne and was found to have a benign appearing stricture approximately 15cm from the anal verge which was traversed with a pediatric scope. Pertinent findings include diverticulosis but an otherwise normal exam. She was subsequent referred to [place] for follow-up. Since her colonoscopy she denies any complaints. She denies any obstructive symptoms and has formed bowel movements daily. She denies history of obstructive symptoms as well. Rarely she will have some issues with constipation that is alleviated with a single dose of milk of magnesia. She is taking daily Benefiber.  She had a CAT scan of her abdomen and pelvis 07/2017 which demonstrated findings consistent with acute uncomplicated sigmoid diverticulitis.  Since was seen in the office 09/2016 where she initially opted to further observe, she has had contiued issues with symptoms. She reported 1-2 bouts a month of crampy abdominal hypogastric pain. These last 1-3 days. In the past she was able to modify her diet with improvement of these frequencies of attack. Now, regardless of what she eats they seem to occur. Her last attack was in the last month. She denies pain elsewhere in her abdomen. She denies any recent issues of fever or chills. She reports having very soft/semi-formed stool daily. It's brown and there is no blood in stool. She denies any issues with nausea or vomiting. She  is tolerating a diet.  INTERVAL HX She was scheduled for surgery at her last visit with [place] for August 2019 but subsequently backed out stating she had many events happened in her life including death and the family and that was not time for her surgery. She returns today to reschedule her procedure. She still having ongoing issues with cramping abdominal discomfort intermittently, and hypogastric region. Still happening 1-2 times per month and lasts one to 3 days and it occurs. She denies any recurrent attacks of fever/chills/nausea/vomiting. She has been having daily bowel movements that fluctuate from formed and hard to soft and watery. She is taking a daily fiber supplement and notes some improvement in her symptoms and she takes this reliably. She denies any changes in her health since she saw [place] last including any new medications or health problems. She can comfortably ascend 2 flights of stairs without stopping and denies any issues with chest pain or shortness of breath.  PMH: Hypothyroidism (well-controlled on Armour Thyroid) PSH: Hysterectomy via low midline incision in the 1980s; abdominoplasty 1989 FHx: Sister had breast cancer 2 years ago. Patient states her last mammogram was 2-3 years ago and she has no plans for any further mammograms due to discomfort and has elected to perform self exams Social: Denies use of tobacco/EtOH/drugs ROS: A comprehensive 10 system review of systems was completed with the patient and pertinent findings as noted above.  The patient is a 63 year old female.   Allergies [facility], RMA; 10/31/2018 11:28 AM) Local Anesthetics (Ester - Lidocaine)  Lidocaine *DERMATOLOGICALS*  Allergies Reconciled   Medication History (Jacqueline Haggett, RMA; 10/31/2018 11:29 AM) LORazepam (1MG  Tablet, Oral) Active. Armour Thyroid (30MG  Tablet, Oral) Active.  Vitamin B12 (3000MCG/ML Liquid, Sublingual) Active. Medications Reconciled Vitamin D3 (Oral)  Specific strength unknown - Active. Biotin (Oral) Specific strength unknown - Active. Potassium (Oral) Specific strength unknown - Active.    Review of Systems Harrell Gave M. Lloyd Ayo MD; 10/31/2018 11:57 AM) General Not Present- Appetite Loss, Chills, Fatigue, Fever, Night Sweats, Weight Gain and Weight Loss. Skin Not Present- Change in Wart/Mole, Dryness, Hives, Jaundice, New Lesions, Non-Healing Wounds, Rash and Ulcer. HEENT Present- Wears glasses/contact lenses. Not Present- Earache, Hearing Loss, Hoarseness, Nose Bleed, Oral Ulcers, Ringing in the Ears, Seasonal Allergies, Sinus Pain, Sore Throat, Visual Disturbances and Yellow Eyes. Respiratory Not Present- Bloody sputum, Chronic Cough, Difficulty Breathing, Snoring and Wheezing. Breast Not Present- Breast Mass, Breast Pain, Nipple Discharge and Skin Changes. Cardiovascular Not Present- Chest Pain, Difficulty Breathing Lying Down, Leg Cramps, Palpitations, Rapid Heart Rate, Shortness of Breath and Swelling of Extremities. Gastrointestinal Present- Bloating, Change in Bowel Habits and Excessive gas. Not Present- Abdominal Pain, Bloody Stool, Chronic diarrhea, Constipation, Difficulty Swallowing, Gets full quickly at meals, Hemorrhoids, Indigestion, Nausea, Rectal Pain and Vomiting. Female Genitourinary Not Present- Frequency, Nocturia, Painful Urination, Pelvic Pain and Urgency. Musculoskeletal Present- Back Pain. Not Present- Joint Pain, Joint Stiffness, Muscle Pain, Muscle Weakness and Swelling of Extremities. Neurological Not Present- Decreased Memory, Fainting, Headaches, Numbness, Seizures, Tingling, Tremor, Trouble walking and Weakness. Psychiatric Present- Depression. Not Present- Anxiety, Bipolar, Change in Sleep Pattern, Fearful and Frequent crying. Endocrine Not Present- Cold Intolerance, Excessive Hunger, Hair Changes, Heat Intolerance, Hot flashes and New Diabetes. Hematology Not Present- Blood Thinners, Easy Bruising, Excessive  bleeding, Gland problems, HIV and Persistent Infections.  Vitals Geni Bers Haggett RMA; 10/31/2018 11:30 AM) 10/31/2018 11:29 AM Weight: 114.4 lb Height: 62in Body Surface Area: 1.51 m Body Mass Index: 20.92 kg/m  Temp.: 97.58F(Temporal)  Pulse: 96 (Regular)  P.OX: 95% (Room air) BP: 112/80 (Sitting, Left Arm, Standard)       Physical Exam Harrell Gave M. Judy Goodenow MD; 10/31/2018 11:59 AM) The physical exam findings are as follows: Note:Constitutional: No acute distress; conversant; no deformities Eyes: Moist conjunctiva; no lid lag; anicteric sclerae; pupils equal round and reactive to light Neck: Trachea midline; no palpable thyromegaly Lungs: Normal respiratory effort; no tactile fremitus CV: Regular rate and rhythm; no palpable thrill; no pitting edema GI: Abdomen soft, nontender, nondistended; no palpable hepatosplenomegaly; scars consistent with prior abdominoplasty and low midline incision from hysterectomy MSK: Normal gait; no clubbing/cyanosis Psychiatric: Appropriate affect; alert and oriented 3 Lymphatic: No palpable cervical or axillary lymphadenopathy    Assessment & Plan Harrell Gave M. Braven Wolk MD; 10/31/2018 12:03 PM) DIVERTICULITIS (K57.92) Story: Kiara Olson is a very pleasant 63yo female here for reevaluation of recurrent attacks of uncompliacted sigmoid diverticulitis and has subsequently developed a symptomatic diverticular stricture Impression: -Her symptoms have persisted and increased. Given her known sigmoidal stricture that was only traversed with the pediatric scope, I believe her attacks and discomfort are related to this. We discussed the possibility that there are other causes. -Given the above the known stricture, I continue to believe she will obtain meaningful benefit from a sigmoid colectomy. We discussed the possibility of a stoma should there be dramatic size mismatch or other intraoperative findings which dictate this to be necessary -We  discussed options moving forward - observation with risks of progression and continued symptoms; we also discussed surgical options - robotic/laparoscopic, possible open, sigmoidectomy with coloproctostomy, flexible sigmoidoscopy, cysto/stents (urology) given hx of diverticulitis and prior pelvic surgery; possible ostomy. -The anatomy and physiology of the GI tract was discussed  at length with the patient again today. The pathophysiology of diverticulitis and strictures was discussed at length as well. She has previously been provided with literature on this condition and the procedure -The planned procedure, material risks (including, but not limited to, pain, bleeding, infection, scarring, need for blood transfusion, damage to surrounding structures- blood vessels/nerves/viscus/organs, damage to ureter, urine leak, leak from anastomosis, need for additional procedures, need for stoma which may be permanent, DVT/PE, hernia, recurrence, worsening of pre-existing medical conditions, pneumonia, heart attack, stroke, death) benefits and alternatives to surgery were discussed at length. I noted a good probability that the procedure would help improve her symptoms. The patient's questions were answered to her satisfaction, she voiced understanding and elected to proceed with surgery. Additionally, we discussed typical postoperative expectations and the recovery process.  Signed electronically by Ileana Roup, MD (10/31/2018 12:03 PM)

## 2018-11-14 ENCOUNTER — Other Ambulatory Visit: Payer: Self-pay | Admitting: Urology

## 2018-11-14 DIAGNOSIS — R1011 Right upper quadrant pain: Secondary | ICD-10-CM | POA: Diagnosis not present

## 2018-11-14 DIAGNOSIS — Z8719 Personal history of other diseases of the digestive system: Secondary | ICD-10-CM | POA: Diagnosis not present

## 2018-11-20 ENCOUNTER — Other Ambulatory Visit: Payer: Self-pay | Admitting: Family Medicine

## 2018-11-20 DIAGNOSIS — R1011 Right upper quadrant pain: Secondary | ICD-10-CM

## 2018-11-28 DIAGNOSIS — R1011 Right upper quadrant pain: Secondary | ICD-10-CM | POA: Diagnosis not present

## 2018-12-04 ENCOUNTER — Encounter (HOSPITAL_COMMUNITY): Payer: Self-pay

## 2018-12-04 NOTE — Patient Instructions (Signed)
Your procedure is scheduled on: Friday, December 08, 2018   Surgery Time:  11:30AM-4:00PM   Report to [place]  Entrance    Report to admitting at 9:30 M   Call this number if you have problems the morning of surgery [phone]   The day before surgery increase fluid intake to prevent dehydration   Miralax:  Mix with 64 oz Gatorade/Powerade. Drink gradually over the next few hours (8 oz glass every 15-30 minutes) until gone the day prior to surgery   Neomycin:  At 2 pm, 3 pm and 10 pm after Miralax bowel prep the day prior to surgery.    Metronidazole:  At 2 pm, 3 pm and 10 pm after Miralax bowel prep the day prior to surgery.    Drink 2 Ensure drinks the night before surgery.     Do not eat food:After Midnight.   May have liquids until 8:30AM day of surgery   CLEAR LIQUID DIET  Foods Allowed                                                                     Foods Excluded  Water, Black Coffee and tea, regular and decaf                             liquids that you cannot  Plain Jell-O in any flavor                                             see through such as: Fruit ices (not with fruit pulp)                                     milk, soups, orange juice  Iced Popsicles                                    All solid food Carbonated beverages, regular and diet                                    Cranberry, grape and apple juices Sports drinks like Gatorade Lightly seasoned clear broth or consume(fat free) Sugar, honey syrup  Sample Menu Breakfast                                Lunch                                     Supper Cranberry juice                    Beef broth  Chicken broth Jell-O                                     Grape juice                           Apple juice Coffee or tea                        Jell-O                                      Popsicle                                                Coffee or tea                         Coffee or tea     Complete one Ensure drink the morning of surgery at 8:30AM prior to scheduled surgery.   Brush your teeth the morning of surgery.   Do NOT smoke after Midnight   Take these medicines the morning of surgery with A SIP OF WATER: Thyroid   Use Flonase if needed                               You may not have any metal on your body including hair pins, jewelry, and body piercings             Do not wear make-up, lotions, powders, perfumes/cologne, or deodorant             Do not wear nail polish.  Do not shave  48 hours prior to surgery.                Do not bring valuables to the hospital. [place].   Contacts, dentures or bridgework may not be worn into surgery.   Leave suitcase in the car. After surgery it may be brought to your room.    Special Instructions: Bring a copy of your healthcare power of attorney and living will documents         the day of surgery if you haven't scanned them in before.              Please read over the following fact sheets you were given:  [facility] - Preparing for Surgery Before surgery, you can play an important role.  Because skin is not sterile, your skin needs to be as free of germs as possible.  You can reduce the number of germs on your skin by washing with CHG (chlorahexidine gluconate) soap before surgery.  CHG is an antiseptic cleaner which kills germs and bonds with the skin to continue killing germs even after washing. Please DO NOT use if you have an allergy to CHG or antibacterial soaps.  If your skin becomes reddened/irritated stop using the CHG and inform your nurse when you arrive at Short Stay. Do not  shave (including legs and underarms) for at least 48 hours prior to the first CHG shower.  You may shave your face/neck.  Please follow these instructions carefully:  1.  Shower with CHG Soap the night before surgery and the  morning of  surgery.  2.  If you choose to wash your hair, wash your hair first as usual with your normal  shampoo.  3.  After you shampoo, rinse your hair and body thoroughly to remove the shampoo.                             4.  Use CHG as you would any other liquid soap.  You can apply chg directly to the skin and wash.  Gently with a scrungie or clean washcloth.  5.  Apply the CHG Soap to your body ONLY FROM THE NECK DOWN.   Do   not use on face/ open                           Wound or open sores. Avoid contact with eyes, ears mouth and   genitals (private parts).                       Wash face,  Genitals (private parts) with your normal soap.             6.  Wash thoroughly, paying special attention to the area where your    surgery  will be performed.  7.  Thoroughly rinse your body with warm water from the neck down.  8.  DO NOT shower/wash with your normal soap after using and rinsing off the CHG Soap.                9.  Pat yourself dry with a clean towel.            10.  Wear clean pajamas.            11.  Place clean sheets on your bed the night of your first shower and do not  sleep with pets. Day of Surgery : Do not apply any lotions/deodorants the morning of surgery.  Please wear clean clothes to the hospital/surgery center.  FAILURE TO FOLLOW THESE INSTRUCTIONS MAY RESULT IN THE CANCELLATION OF YOUR SURGERY  PATIENT SIGNATURE_________________________________  NURSE SIGNATURE__________________________________  ________________________________________________________________________   Kiara Olson  An incentive spirometer is a tool that can help keep your lungs clear and active. This tool measures how well you are filling your lungs with each breath. Taking long deep breaths may help reverse or decrease the chance of developing breathing (pulmonary) problems (especially infection) following:  A long period of time when you are unable to move or be active. BEFORE THE PROCEDURE    If the spirometer includes an indicator to show your best effort, your nurse or respiratory therapist will set it to a desired goal.  If possible, sit up straight or lean slightly forward. Try not to slouch.  Hold the incentive spirometer in an upright position. INSTRUCTIONS FOR USE  1. Sit on the edge of your bed if possible, or sit up as far as you can in bed or on a chair. 2. Hold the incentive spirometer in an upright position. 3. Breathe out normally. 4. Place the mouthpiece in your mouth and seal your lips tightly around it. 5. Breathe in slowly and as  deeply as possible, raising the piston or the ball toward the top of the column. 6. Hold your breath for 3-5 seconds or for as long as possible. Allow the piston or ball to fall to the bottom of the column. 7. Remove the mouthpiece from your mouth and breathe out normally. 8. Rest for a few seconds and repeat Steps 1 through 7 at least 10 times every 1-2 hours when you are awake. Take your time and take a few normal breaths between deep breaths. 9. The spirometer may include an indicator to show your best effort. Use the indicator as a goal to work toward during each repetition. 10. After each set of 10 deep breaths, practice coughing to be sure your lungs are clear. If you have an incision (the cut made at the time of surgery), support your incision when coughing by placing a pillow or rolled up towels firmly against it. Once you are able to get out of bed, walk around indoors and cough well. You may stop using the incentive spirometer when instructed by your caregiver.  RISKS AND COMPLICATIONS  Take your time so you do not get dizzy or light-headed.  If you are in pain, you may need to take or ask for pain medication before doing incentive spirometry. It is harder to take a deep breath if you are having pain. AFTER USE  Rest and breathe slowly and easily.  It can be helpful to keep track of a log of your progress. Your caregiver  can provide you with a simple table to help with this. If you are using the spirometer at home, follow these instructions: [place] IF:   You are having difficultly using the spirometer.  You have trouble using the spirometer as often as instructed.  Your pain medication is not giving enough relief while using the spirometer.  You develop fever of 100.5 F (38.1 C) or higher. SEEK IMMEDIATE MEDICAL CARE IF:   You cough up bloody sputum that had not been present before.  You develop fever of 102 F (38.9 C) or greater.  You develop worsening pain at or near the incision site. MAKE SURE YOU:   Understand these instructions.  Will watch your condition.  Will get help right away if you are not doing well or get worse. Document Released: 01/24/2007 Document Revised: 12/06/2011 Document Reviewed: 03/27/2007 [employer] Patient Information 2014 [employer], [place].   ________________________________________________________________________  WHAT IS A BLOOD TRANSFUSION? Blood Transfusion Information  A transfusion is the replacement of blood or some of its parts. Blood is made up of multiple cells which provide different functions.  Red blood cells carry oxygen and are used for blood loss replacement.  White blood cells fight against infection.  Platelets control bleeding.  Plasma helps clot blood.  Other blood products are available for specialized needs, such as hemophilia or other clotting disorders. BEFORE THE TRANSFUSION  Who gives blood for transfusions?   Healthy volunteers who are fully evaluated to make sure their blood is safe. This is blood bank blood. Transfusion therapy is the safest it has ever been in the practice of medicine. Before blood is taken from a donor, a complete history is taken to make sure that person has no history of diseases nor engages in risky social behavior (examples are intravenous drug use or sexual activity with multiple partners). The  donor's travel history is screened to minimize risk of transmitting infections, such as malaria. The donated blood is tested for signs of infectious  diseases, such as HIV and hepatitis. The blood is then tested to be sure it is compatible with you in order to minimize the chance of a transfusion reaction. If you or a relative donates blood, this is often done in anticipation of surgery and is not appropriate for emergency situations. It takes many days to process the donated blood. RISKS AND COMPLICATIONS Although transfusion therapy is very safe and saves many lives, the main dangers of transfusion include:   Getting an infectious disease.  Developing a transfusion reaction. This is an allergic reaction to something in the blood you were given. Every precaution is taken to prevent this. The decision to have a blood transfusion has been considered carefully by your caregiver before blood is given. Blood is not given unless the benefits outweigh the risks. AFTER THE TRANSFUSION  Right after receiving a blood transfusion, you will usually feel much better and more energetic. This is especially true if your red blood cells have gotten low (anemic). The transfusion raises the level of the red blood cells which carry oxygen, and this usually causes an energy increase.  The nurse administering the transfusion will monitor you carefully for complications. HOME CARE INSTRUCTIONS  No special instructions are needed after a transfusion. You may find your energy is better. Speak with your caregiver about any limitations on activity for underlying diseases you may have. SEEK MEDICAL CARE IF:   Your condition is not improving after your transfusion.  You develop redness or irritation at the intravenous (IV) site. SEEK IMMEDIATE MEDICAL CARE IF:  Any of the following symptoms occur over the next 12 hours:  Shaking chills.  You have a temperature by mouth above 102 F (38.9 C), not controlled by  medicine.  Chest, back, or muscle pain.  People around you feel you are not acting correctly or are confused.  Shortness of breath or difficulty breathing.  Dizziness and fainting.  You get a rash or develop hives.  You have a decrease in urine output.  Your urine turns a dark color or changes to pink, red, or brown. Any of the following symptoms occur over the next 10 days:  You have a temperature by mouth above 102 F (38.9 C), not controlled by medicine.  Shortness of breath.  Weakness after normal activity.  The white part of the eye turns yellow (jaundice).  You have a decrease in the amount of urine or are urinating less often.  Your urine turns a dark color or changes to pink, red, or brown. Document Released: 09/10/2000 Document Revised: 12/06/2011 Document Reviewed: 04/29/2008 [facility] Patient Information 2014 [place], [place].  _______________________________________________________________________

## 2018-12-05 ENCOUNTER — Encounter (HOSPITAL_COMMUNITY)
Admission: RE | Admit: 2018-12-05 | Discharge: 2018-12-05 | Disposition: A | Discharge status: Home / Self Care | Payer: Medicare HMO | Source: Ambulatory Visit | Attending: Surgery | Admitting: Surgery

## 2018-12-05 ENCOUNTER — Encounter (HOSPITAL_COMMUNITY): Payer: Self-pay

## 2018-12-05 ENCOUNTER — Other Ambulatory Visit: Payer: Self-pay

## 2018-12-05 ENCOUNTER — Ambulatory Visit
Admission: RE | Admit: 2018-12-05 | Discharge: 2018-12-05 | Disposition: A | Discharge status: Home / Self Care | Payer: Medicare HMO | Source: Ambulatory Visit | Attending: Family Medicine | Admitting: Family Medicine

## 2018-12-05 DIAGNOSIS — K5732 Diverticulitis of large intestine without perforation or abscess without bleeding: Secondary | ICD-10-CM | POA: Diagnosis not present

## 2018-12-05 DIAGNOSIS — E8801 Alpha-1-antitrypsin deficiency: Secondary | ICD-10-CM | POA: Diagnosis not present

## 2018-12-05 DIAGNOSIS — Z87891 Personal history of nicotine dependence: Secondary | ICD-10-CM | POA: Diagnosis not present

## 2018-12-05 DIAGNOSIS — Z23 Encounter for immunization: Secondary | ICD-10-CM | POA: Diagnosis not present

## 2018-12-05 DIAGNOSIS — R1011 Right upper quadrant pain: Secondary | ICD-10-CM | POA: Diagnosis not present

## 2018-12-05 DIAGNOSIS — Z01818 Encounter for other preprocedural examination: Secondary | ICD-10-CM | POA: Insufficient documentation

## 2018-12-05 DIAGNOSIS — E039 Hypothyroidism, unspecified: Secondary | ICD-10-CM | POA: Diagnosis not present

## 2018-12-05 HISTORY — DX: Other specified postprocedural states: Z98.890

## 2018-12-05 HISTORY — DX: Insomnia, unspecified: G47.00

## 2018-12-05 HISTORY — DX: Nutritional anemia, unspecified: D53.9

## 2018-12-05 HISTORY — DX: Alpha-1-antitrypsin deficiency: E88.01

## 2018-12-05 HISTORY — DX: Diverticulitis of intestine, part unspecified, without perforation or abscess without bleeding: K57.92

## 2018-12-05 HISTORY — DX: Other seasonal allergic rhinitis: J30.2

## 2018-12-05 HISTORY — DX: Atherosclerosis of aorta: I70.0

## 2018-12-05 HISTORY — DX: Migraine, unspecified, not intractable, without status migrainosus: G43.909

## 2018-12-05 HISTORY — DX: Nausea with vomiting, unspecified: R11.2

## 2018-12-05 HISTORY — DX: Hypothyroidism, unspecified: E03.9

## 2018-12-05 HISTORY — DX: Gastro-esophageal reflux disease without esophagitis: K21.9

## 2018-12-05 LAB — PROTIME-INR
INR: 0.9 (ref 0.8–1.2)
Prothrombin Time: 12.4 seconds (ref 11.4–15.2)

## 2018-12-05 LAB — ABO/RH: ABO/RH(D): A POS

## 2018-12-05 LAB — CBC WITH DIFFERENTIAL/PLATELET
Abs Immature Granulocytes: 0.01 10*3/uL (ref 0.00–0.07)
Basophils Absolute: 0 10*3/uL (ref 0.0–0.1)
Basophils Relative: 1 %
Eosinophils Absolute: 0.1 10*3/uL (ref 0.0–0.5)
Eosinophils Relative: 1 %
HCT: 37.7 % (ref 36.0–46.0)
Hemoglobin: 12 g/dL (ref 12.0–15.0)
IMMATURE GRANULOCYTES: 0 %
Lymphocytes Relative: 40 %
Lymphs Abs: 1.7 10*3/uL (ref 0.7–4.0)
MCH: 34 pg (ref 26.0–34.0)
MCHC: 31.8 g/dL (ref 30.0–36.0)
MCV: 106.8 fL — ABNORMAL HIGH (ref 80.0–100.0)
Monocytes Absolute: 0.3 10*3/uL (ref 0.1–1.0)
Monocytes Relative: 7 %
NEUTROS PCT: 51 %
Neutro Abs: 2.2 10*3/uL (ref 1.7–7.7)
Platelets: 225 10*3/uL (ref 150–400)
RBC: 3.53 MIL/uL — AB (ref 3.87–5.11)
RDW: 12.9 % (ref 11.5–15.5)
WBC: 4.3 10*3/uL (ref 4.0–10.5)
nRBC: 0 % (ref 0.0–0.2)

## 2018-12-05 LAB — COMPREHENSIVE METABOLIC PANEL
ALT: 22 U/L (ref 0–44)
AST: 20 U/L (ref 15–41)
Albumin: 4.2 g/dL (ref 3.5–5.0)
Alkaline Phosphatase: 68 U/L (ref 38–126)
Anion gap: 8 (ref 5–15)
BUN: 17 mg/dL (ref 8–23)
CALCIUM: 9.2 mg/dL (ref 8.9–10.3)
CHLORIDE: 104 mmol/L (ref 98–111)
CO2: 27 mmol/L (ref 22–32)
Creatinine, Ser: 0.83 mg/dL (ref 0.44–1.00)
GFR calc Af Amer: >60 mL/min (ref >60)
GFR calc non Af Amer: >60 mL/min (ref >60)
Glucose, Bld: 109 mg/dL — ABNORMAL HIGH (ref 70–99)
Potassium: 3.8 mmol/L (ref 3.5–5.1)
Sodium: 139 mmol/L (ref 135–145)
Total Bilirubin: 0.5 mg/dL (ref 0.3–1.2)
Total Protein: 6.9 g/dL (ref 6.5–8.1)

## 2018-12-05 LAB — HEMOGLOBIN A1C
Hgb A1c MFr Bld: 5.3 % (ref 4.8–5.6)
MEAN PLASMA GLUCOSE: 105.41 mg/dL

## 2018-12-05 LAB — APTT: aPTT: 29 seconds (ref 24–36)

## 2018-12-05 NOTE — Consult Note (Signed)
[place] Nurse ostomy consult note  [place] Nurse requested for preoperative stoma site marking by Dr. Dema Severin.  Discussed surgical procedure and stoma creation with patient.  Explained role of the [place] nurse team. Answered patient questions. Patient has been told that the surgical plan is for reanastomosis.  Examined patient sitting and standing in order to place the marking in the patient's visual field, away from any creases or abdominal contour issues and within the rectus muscle. Many fine creases in the lower quadrants of the abdomen noted. If a stoma is created, this could be mildly problematic.  Marked for colostomy in the LLQ  5cm to the left of the umbilicus and 4cm below the umbilicus.  Marked for ileostomy in the RLQ  5.5cm to the right of the umbilicus and  1cm above the umbilicus.  Patient's abdomen cleansed with CHG wipes at site markings, allowed to air dry prior to marking. Covered marks (2) with thin film transparent dressings to preserve marks until date of surgery (Friday, March 13).   [place] nursing team will not follow, but will remain available to this patient, the nursing and medical teams should an ostomy be created intraoperatively on the 13th.  Please re-consult if needed. Thanks, Maudie Flakes, MSN, RN, [place], Arther Abbott  Pager# [phone]

## 2018-12-07 MED ORDER — BUPIVACAINE LIPOSOME 1.3 % IJ SUSP
20.0000 mL | Freq: Once | INTRAMUSCULAR | Status: DC
  Filled 2018-12-07: qty 20

## 2018-12-07 NOTE — Pre-Procedure Instructions (Signed)
CBC/diff and CMP results 12/05/2018 sent to Dr. Dema Severin via epic.

## 2018-12-08 ENCOUNTER — Inpatient Hospital Stay (HOSPITAL_COMMUNITY): Payer: Medicare HMO | Admitting: Physician Assistant

## 2018-12-08 ENCOUNTER — Encounter (HOSPITAL_COMMUNITY)
Admission: RE | Disposition: A | Discharge status: Home / Self Care | Payer: Self-pay | Source: Home / Self Care | Attending: Surgery

## 2018-12-08 ENCOUNTER — Inpatient Hospital Stay (HOSPITAL_COMMUNITY)
Admission: RE | Admit: 2018-12-08 | Discharge: 2018-12-11 | DRG: 331 | Disposition: A | Discharge status: Home / Self Care | Payer: Medicare HMO | Attending: Surgery | Admitting: Surgery

## 2018-12-08 ENCOUNTER — Inpatient Hospital Stay (HOSPITAL_COMMUNITY): Payer: Medicare HMO

## 2018-12-08 ENCOUNTER — Encounter (HOSPITAL_COMMUNITY): Payer: Self-pay | Admitting: General Practice

## 2018-12-08 ENCOUNTER — Other Ambulatory Visit: Payer: Self-pay

## 2018-12-08 ENCOUNTER — Inpatient Hospital Stay (HOSPITAL_COMMUNITY): Payer: Medicare HMO | Admitting: Certified Registered"

## 2018-12-08 DIAGNOSIS — E8801 Alpha-1-antitrypsin deficiency: Secondary | ICD-10-CM | POA: Diagnosis present

## 2018-12-08 DIAGNOSIS — K5730 Diverticulosis of large intestine without perforation or abscess without bleeding: Secondary | ICD-10-CM | POA: Diagnosis not present

## 2018-12-08 DIAGNOSIS — Z23 Encounter for immunization: Secondary | ICD-10-CM | POA: Diagnosis not present

## 2018-12-08 DIAGNOSIS — Z87891 Personal history of nicotine dependence: Secondary | ICD-10-CM | POA: Diagnosis not present

## 2018-12-08 DIAGNOSIS — K5732 Diverticulitis of large intestine without perforation or abscess without bleeding: Secondary | ICD-10-CM | POA: Diagnosis not present

## 2018-12-08 DIAGNOSIS — Z9049 Acquired absence of other specified parts of digestive tract: Secondary | ICD-10-CM

## 2018-12-08 DIAGNOSIS — Z408 Encounter for other prophylactic surgery: Secondary | ICD-10-CM | POA: Diagnosis not present

## 2018-12-08 DIAGNOSIS — E039 Hypothyroidism, unspecified: Secondary | ICD-10-CM | POA: Diagnosis not present

## 2018-12-08 DIAGNOSIS — R109 Unspecified abdominal pain: Secondary | ICD-10-CM | POA: Diagnosis not present

## 2018-12-08 HISTORY — PX: FLEXIBLE SIGMOIDOSCOPY: SHX5431

## 2018-12-08 HISTORY — PX: CYSTOSCOPY W/ URETERAL STENT PLACEMENT: SHX1429

## 2018-12-08 LAB — TYPE AND SCREEN
ABO/RH(D): A POS
Antibody Screen: NEGATIVE

## 2018-12-08 SURGERY — COLECTOMY, PARTIAL, ROBOT-ASSISTED, LAPAROSCOPIC
Anesthesia: General | Site: Urethra

## 2018-12-08 MED ORDER — ENSURE SURGERY PO LIQD
237.0000 mL | Freq: Two times a day (BID) | ORAL | Status: DC
  Administered 2018-12-09 – 2018-12-11 (×5): 237 mL via ORAL
  Filled 2018-12-08 (×6): qty 237

## 2018-12-08 MED ORDER — FLUTICASONE PROPIONATE 50 MCG/ACT NA SUSP
1.0000 | Freq: Every day | NASAL | Status: DC | PRN
  Filled 2018-12-08: qty 16

## 2018-12-08 MED ORDER — PHENYLEPHRINE HCL 10 MG/ML IJ SOLN
INTRAMUSCULAR | Status: AC
  Filled 2018-12-08: qty 2

## 2018-12-08 MED ORDER — ONDANSETRON HCL 4 MG/2ML IJ SOLN
INTRAMUSCULAR | Status: DC | PRN
  Administered 2018-12-08: 4 mg via INTRAVENOUS

## 2018-12-08 MED ORDER — HYDROMORPHONE HCL 1 MG/ML IJ SOLN
INTRAMUSCULAR | Status: AC
  Filled 2018-12-08: qty 1

## 2018-12-08 MED ORDER — CHLORHEXIDINE GLUCONATE CLOTH 2 % EX PADS: 6.0000 | MEDICATED_PAD | Freq: Once | CUTANEOUS | Status: DC

## 2018-12-08 MED ORDER — ONDANSETRON HCL 4 MG/2ML IJ SOLN
4.0000 mg | Freq: Four times a day (QID) | INTRAMUSCULAR | Status: DC | PRN
  Administered 2018-12-08 – 2018-12-11 (×4): 4 mg via INTRAVENOUS
  Filled 2018-12-08 (×4): qty 2

## 2018-12-08 MED ORDER — LACTATED RINGERS IV SOLN
INTRAVENOUS | Status: DC
  Administered 2018-12-08 (×2): via INTRAVENOUS

## 2018-12-08 MED ORDER — GABAPENTIN 300 MG PO CAPS
300.0000 mg | ORAL_CAPSULE | ORAL | Status: AC
  Administered 2018-12-08: 300 mg via ORAL
  Filled 2018-12-08: qty 1

## 2018-12-08 MED ORDER — IBUPROFEN 200 MG PO TABS
600.0000 mg | ORAL_TABLET | Freq: Four times a day (QID) | ORAL | Status: DC | PRN
  Administered 2018-12-09 – 2018-12-10 (×3): 600 mg via ORAL
  Filled 2018-12-08 (×4): qty 3

## 2018-12-08 MED ORDER — ONDANSETRON HCL 4 MG/2ML IJ SOLN: 4.0000 mg | Freq: Four times a day (QID) | INTRAMUSCULAR | Status: DC | PRN

## 2018-12-08 MED ORDER — SUGAMMADEX SODIUM 200 MG/2ML IV SOLN
INTRAVENOUS | Status: AC
  Filled 2018-12-08: qty 2

## 2018-12-08 MED ORDER — INDOCYANINE GREEN 25 MG IV SOLR
INTRAVENOUS | Status: DC | PRN
  Administered 2018-12-08: 6.25 mg via INTRAVENOUS

## 2018-12-08 MED ORDER — HYDRALAZINE HCL 20 MG/ML IJ SOLN: 10.0000 mg | INTRAMUSCULAR | Status: DC | PRN

## 2018-12-08 MED ORDER — HEPARIN SODIUM (PORCINE) 5000 UNIT/ML IJ SOLN
5000.0000 [IU] | Freq: Three times a day (TID) | INTRAMUSCULAR | Status: DC
  Administered 2018-12-09 – 2018-12-11 (×6): 5000 [IU] via SUBCUTANEOUS
  Filled 2018-12-08 (×6): qty 1

## 2018-12-08 MED ORDER — EPHEDRINE SULFATE 50 MG/ML IJ SOLN
INTRAMUSCULAR | Status: DC | PRN
  Administered 2018-12-08: 5 mg via INTRAVENOUS

## 2018-12-08 MED ORDER — ACETAMINOPHEN 500 MG PO TABS
1000.0000 mg | ORAL_TABLET | Freq: Four times a day (QID) | ORAL | Status: DC
  Administered 2018-12-09 – 2018-12-11 (×9): 1000 mg via ORAL
  Filled 2018-12-08 (×10): qty 2

## 2018-12-08 MED ORDER — ONDANSETRON HCL 4 MG/2ML IJ SOLN
INTRAMUSCULAR | Status: AC
  Filled 2018-12-08: qty 2

## 2018-12-08 MED ORDER — SODIUM CHLORIDE 0.9 % IV SOLN
INTRAVENOUS | Status: DC | PRN
  Administered 2018-12-08: 20 ug/min via INTRAVENOUS

## 2018-12-08 MED ORDER — ONDANSETRON HCL 4 MG PO TABS
4.0000 mg | ORAL_TABLET | Freq: Four times a day (QID) | ORAL | Status: DC | PRN
  Administered 2018-12-11: 4 mg via ORAL
  Filled 2018-12-08: qty 1

## 2018-12-08 MED ORDER — LIDOCAINE HCL 2 % IJ SOLN
INTRAMUSCULAR | Status: AC
  Filled 2018-12-08: qty 20

## 2018-12-08 MED ORDER — BUPIVACAINE-EPINEPHRINE (PF) 0.5% -1:200000 IJ SOLN
INTRAMUSCULAR | Status: AC
  Filled 2018-12-08: qty 1.8

## 2018-12-08 MED ORDER — ONDANSETRON HCL 4 MG/2ML IJ SOLN
4.0000 mg | Freq: Once | INTRAMUSCULAR | Status: AC
  Administered 2018-12-08: 4 mg via INTRAVENOUS

## 2018-12-08 MED ORDER — SCOPOLAMINE 1 MG/3DAYS TD PT72
1.0000 | MEDICATED_PATCH | TRANSDERMAL | Status: DC
  Administered 2018-12-08: 1.5 mg via TRANSDERMAL
  Filled 2018-12-08: qty 1

## 2018-12-08 MED ORDER — EPHEDRINE 5 MG/ML INJ
INTRAVENOUS | Status: AC
  Filled 2018-12-08: qty 10

## 2018-12-08 MED ORDER — VITAMIN B-12 1000 MCG PO TABS
2500.0000 ug | ORAL_TABLET | Freq: Every day | ORAL | Status: DC
  Administered 2018-12-09 – 2018-12-11 (×3): 2500 ug via ORAL
  Filled 2018-12-08 (×3): qty 3

## 2018-12-08 MED ORDER — LACTATED RINGERS IV SOLN
INTRAVENOUS | Status: DC
  Administered 2018-12-09 – 2018-12-10 (×3): via INTRAVENOUS

## 2018-12-08 MED ORDER — LACTATED RINGERS IR SOLN
Status: DC | PRN
  Administered 2018-12-08: 1000 mL

## 2018-12-08 MED ORDER — SODIUM CHLORIDE (PF) 0.9 % IJ SOLN
INTRAMUSCULAR | Status: AC
  Filled 2018-12-08: qty 10

## 2018-12-08 MED ORDER — PHENYLEPHRINE 40 MCG/ML (10ML) SYRINGE FOR IV PUSH (FOR BLOOD PRESSURE SUPPORT)
PREFILLED_SYRINGE | INTRAVENOUS | Status: AC
  Filled 2018-12-08: qty 10

## 2018-12-08 MED ORDER — ALVIMOPAN 12 MG PO CAPS
12.0000 mg | ORAL_CAPSULE | Freq: Two times a day (BID) | ORAL | Status: DC
  Administered 2018-12-09: 12 mg via ORAL
  Filled 2018-12-08 (×2): qty 1

## 2018-12-08 MED ORDER — BUPIVACAINE-EPINEPHRINE (PF) 0.25% -1:200000 IJ SOLN
INTRAMUSCULAR | Status: AC
  Filled 2018-12-08: qty 30

## 2018-12-08 MED ORDER — ROCURONIUM BROMIDE 10 MG/ML (PF) SYRINGE
PREFILLED_SYRINGE | INTRAVENOUS | Status: AC
  Filled 2018-12-08: qty 10

## 2018-12-08 MED ORDER — FENTANYL CITRATE (PF) 100 MCG/2ML IJ SOLN
INTRAMUSCULAR | Status: AC
  Filled 2018-12-08: qty 2

## 2018-12-08 MED ORDER — PROPOFOL 10 MG/ML IV BOLUS
INTRAVENOUS | Status: DC | PRN
  Administered 2018-12-08: 30 mg via INTRAVENOUS
  Administered 2018-12-08: 100 mg via INTRAVENOUS

## 2018-12-08 MED ORDER — NEOMYCIN SULFATE 500 MG PO TABS: 1000.0000 mg | ORAL_TABLET | ORAL | Status: DC

## 2018-12-08 MED ORDER — PHENYLEPHRINE 40 MCG/ML (10ML) SYRINGE FOR IV PUSH (FOR BLOOD PRESSURE SUPPORT)
PREFILLED_SYRINGE | INTRAVENOUS | Status: DC | PRN
  Administered 2018-12-08: 120 ug via INTRAVENOUS
  Administered 2018-12-08: 40 ug via INTRAVENOUS
  Administered 2018-12-08 (×2): 120 ug via INTRAVENOUS

## 2018-12-08 MED ORDER — HYDROMORPHONE HCL 1 MG/ML IJ SOLN
0.2500 mg | INTRAMUSCULAR | Status: DC | PRN
  Administered 2018-12-08 (×2): 0.5 mg via INTRAVENOUS

## 2018-12-08 MED ORDER — FENTANYL CITRATE (PF) 100 MCG/2ML IJ SOLN
25.0000 ug | INTRAMUSCULAR | Status: DC | PRN
  Administered 2018-12-08: 50 ug via INTRAVENOUS

## 2018-12-08 MED ORDER — 0.9 % SODIUM CHLORIDE (POUR BTL) OPTIME
TOPICAL | Status: DC | PRN
  Administered 2018-12-08: 2000 mL

## 2018-12-08 MED ORDER — LORAZEPAM 1 MG PO TABS: 1.5000 mg | ORAL_TABLET | Freq: Every evening | ORAL | Status: DC | PRN

## 2018-12-08 MED ORDER — ALUM & MAG HYDROXIDE-SIMETH 200-200-20 MG/5ML PO SUSP: 30.0000 mL | Freq: Four times a day (QID) | ORAL | Status: DC | PRN

## 2018-12-08 MED ORDER — PROPOFOL 10 MG/ML IV BOLUS
INTRAVENOUS | Status: AC
  Filled 2018-12-08: qty 20

## 2018-12-08 MED ORDER — BUPIVACAINE LIPOSOME 1.3 % IJ SUSP
INTRAMUSCULAR | Status: DC | PRN
  Administered 2018-12-08: 20 mL

## 2018-12-08 MED ORDER — METRONIDAZOLE 500 MG PO TABS: 1000.0000 mg | ORAL_TABLET | ORAL | Status: DC

## 2018-12-08 MED ORDER — BUPIVACAINE-EPINEPHRINE (PF) 0.25% -1:200000 IJ SOLN
INTRAMUSCULAR | Status: DC | PRN
  Administered 2018-12-08: 30 mL via PERINEURAL

## 2018-12-08 MED ORDER — HYDROMORPHONE HCL 1 MG/ML IJ SOLN
0.5000 mg | INTRAMUSCULAR | Status: DC | PRN
  Administered 2018-12-08 – 2018-12-09 (×3): 0.5 mg via INTRAVENOUS
  Filled 2018-12-08 (×3): qty 0.5

## 2018-12-08 MED ORDER — TRAMADOL HCL 50 MG PO TABS
50.0000 mg | ORAL_TABLET | Freq: Four times a day (QID) | ORAL | Status: DC | PRN
  Administered 2018-12-09 – 2018-12-10 (×2): 50 mg via ORAL
  Filled 2018-12-08 (×4): qty 1

## 2018-12-08 MED ORDER — SODIUM CHLORIDE 0.9 % IV SOLN
2.0000 g | INTRAVENOUS | Status: AC
  Administered 2018-12-08: 2 g via INTRAVENOUS
  Filled 2018-12-08: qty 2

## 2018-12-08 MED ORDER — FENTANYL CITRATE (PF) 250 MCG/5ML IJ SOLN
INTRAMUSCULAR | Status: DC | PRN
  Administered 2018-12-08 (×5): 50 ug via INTRAVENOUS

## 2018-12-08 MED ORDER — ALVIMOPAN 12 MG PO CAPS
12.0000 mg | ORAL_CAPSULE | ORAL | Status: AC
  Administered 2018-12-08: 12 mg via ORAL
  Filled 2018-12-08: qty 1

## 2018-12-08 MED ORDER — HEPARIN SODIUM (PORCINE) 5000 UNIT/ML IJ SOLN
5000.0000 [IU] | Freq: Once | INTRAMUSCULAR | Status: AC
  Administered 2018-12-08: 5000 [IU] via SUBCUTANEOUS
  Filled 2018-12-08: qty 1

## 2018-12-08 MED ORDER — MIDAZOLAM HCL 2 MG/2ML IJ SOLN
INTRAMUSCULAR | Status: AC
  Filled 2018-12-08: qty 2

## 2018-12-08 MED ORDER — DIPHENHYDRAMINE HCL 12.5 MG/5ML PO ELIX: 12.5000 mg | ORAL_SOLUTION | Freq: Four times a day (QID) | ORAL | Status: DC | PRN

## 2018-12-08 MED ORDER — ROCURONIUM BROMIDE 10 MG/ML (PF) SYRINGE
PREFILLED_SYRINGE | INTRAVENOUS | Status: DC | PRN
  Administered 2018-12-08 (×2): 10 mg via INTRAVENOUS
  Administered 2018-12-08: 50 mg via INTRAVENOUS

## 2018-12-08 MED ORDER — OXYCODONE HCL 5 MG PO TABS: 5.0000 mg | ORAL_TABLET | Freq: Once | ORAL | Status: DC | PRN

## 2018-12-08 MED ORDER — B-12 2500 MCG SL SUBL: 2500.0000 ug | SUBLINGUAL_TABLET | Freq: Every day | SUBLINGUAL | Status: DC

## 2018-12-08 MED ORDER — FENTANYL CITRATE (PF) 250 MCG/5ML IJ SOLN
INTRAMUSCULAR | Status: AC
  Filled 2018-12-08: qty 5

## 2018-12-08 MED ORDER — SUGAMMADEX SODIUM 200 MG/2ML IV SOLN
INTRAVENOUS | Status: DC | PRN
  Administered 2018-12-08: 100 mg via INTRAVENOUS

## 2018-12-08 MED ORDER — KETAMINE HCL 10 MG/ML IJ SOLN
INTRAMUSCULAR | Status: DC | PRN
  Administered 2018-12-08: 15 mg via INTRAVENOUS
  Administered 2018-12-08: 10 mg via INTRAVENOUS

## 2018-12-08 MED ORDER — MIDAZOLAM HCL 2 MG/2ML IJ SOLN
INTRAMUSCULAR | Status: DC | PRN
  Administered 2018-12-08: 2 mg via INTRAVENOUS

## 2018-12-08 MED ORDER — PNEUMOCOCCAL VAC POLYVALENT 25 MCG/0.5ML IJ INJ
0.5000 mL | INJECTION | INTRAMUSCULAR | Status: AC
  Administered 2018-12-09: 0.5 mL via INTRAMUSCULAR
  Filled 2018-12-08: qty 0.5

## 2018-12-08 MED ORDER — DIPHENHYDRAMINE HCL 50 MG/ML IJ SOLN
12.5000 mg | Freq: Four times a day (QID) | INTRAMUSCULAR | Status: DC | PRN
  Administered 2018-12-09: 12.5 mg via INTRAVENOUS
  Filled 2018-12-08: qty 1

## 2018-12-08 MED ORDER — INFLUENZA VAC SPLIT HIGH-DOSE 0.5 ML IM SUSY
0.5000 mL | PREFILLED_SYRINGE | INTRAMUSCULAR | Status: AC
  Administered 2018-12-09: 0.5 mL via INTRAMUSCULAR
  Filled 2018-12-08: qty 0.5

## 2018-12-08 MED ORDER — POLYETHYLENE GLYCOL 3350 17 GM/SCOOP PO POWD: 1.0000 | Freq: Once | ORAL | Status: DC

## 2018-12-08 MED ORDER — ACETAMINOPHEN 500 MG PO TABS
1000.0000 mg | ORAL_TABLET | ORAL | Status: AC
  Administered 2018-12-08: 1000 mg via ORAL
  Filled 2018-12-08: qty 2

## 2018-12-08 MED ORDER — STERILE WATER FOR IRRIGATION IR SOLN
Status: DC | PRN
  Administered 2018-12-08: 1000 mL

## 2018-12-08 MED ORDER — DEXAMETHASONE SODIUM PHOSPHATE 10 MG/ML IJ SOLN
INTRAMUSCULAR | Status: DC | PRN
  Administered 2018-12-08: 8 mg via INTRAVENOUS

## 2018-12-08 MED ORDER — OXYCODONE HCL 5 MG/5ML PO SOLN: 5.0000 mg | Freq: Once | ORAL | Status: DC | PRN

## 2018-12-08 MED ORDER — THYROID 30 MG PO TABS
30.0000 mg | ORAL_TABLET | Freq: Every day | ORAL | Status: DC
  Administered 2018-12-09 – 2018-12-11 (×3): 30 mg via ORAL
  Filled 2018-12-08 (×3): qty 1

## 2018-12-08 MED ORDER — IOHEXOL 300 MG/ML  SOLN
INTRAMUSCULAR | Status: DC | PRN
  Administered 2018-12-08: 18 mL via INTRAVENOUS

## 2018-12-08 SURGICAL SUPPLY — 122 items
ADAPTER GOLDBERG URETERAL (ADAPTER) ×1 IMPLANT
ADH SKN CLS APL DERMABOND .7 (GAUZE/BANDAGES/DRESSINGS) ×3
ADPR CATH 15X14FR FL DRN BG (ADAPTER) ×3
APPLIER CLIP 5 13 M/L LIGAMAX5 (MISCELLANEOUS)
APPLIER CLIP ROT 10 11.4 M/L (STAPLE)
APR CLP MED LRG 11.4X10 (STAPLE)
APR CLP MED LRG 5 ANG JAW (MISCELLANEOUS)
BAG URO CATCHER STRL LF (MISCELLANEOUS) ×4 IMPLANT
BASKET ZERO TIP NITINOL 2.4FR (BASKET) IMPLANT
BLADE EXTENDED COATED 6.5IN (ELECTRODE) ×3 IMPLANT
BSKT STON RTRVL ZERO TP 2.4FR (BASKET)
CANNULA REDUC XI 12-8 STAPL (CANNULA) ×1
CANNULA REDUCER 12-8 DVNC XI (CANNULA) ×3 IMPLANT
CATH INTERMIT  6FR 70CM (CATHETERS) ×1 IMPLANT
CATH URET 5FR 28IN OPEN ENDED (CATHETERS) ×1 IMPLANT
CELLS DAT CNTRL 66122 CELL SVR (MISCELLANEOUS) IMPLANT
CHLORAPREP W/TINT 26ML (MISCELLANEOUS) ×3 IMPLANT
CLIP APPLIE 5 13 M/L LIGAMAX5 (MISCELLANEOUS) IMPLANT
CLIP APPLIE ROT 10 11.4 M/L (STAPLE) IMPLANT
CLIP VESOLOCK LG 6/CT PURPLE (CLIP) IMPLANT
CLIP VESOLOCK MED LG 6/CT (CLIP) IMPLANT
CLOTH BEACON ORANGE TIMEOUT ST (SAFETY) ×4 IMPLANT
COVER SURGICAL LIGHT HANDLE (MISCELLANEOUS) ×8 IMPLANT
COVER TIP SHEARS 8 DVNC (MISCELLANEOUS) ×3 IMPLANT
COVER TIP SHEARS 8MM DA VINCI (MISCELLANEOUS) ×1
COVER WAND RF STERILE (DRAPES) ×1 IMPLANT
DECANTER SPIKE VIAL GLASS SM (MISCELLANEOUS) ×4 IMPLANT
DERMABOND ADVANCED (GAUZE/BANDAGES/DRESSINGS) ×1
DERMABOND ADVANCED .7 DNX12 (GAUZE/BANDAGES/DRESSINGS) IMPLANT
DEVICE TROCAR PUNCTURE CLOSURE (ENDOMECHANICALS) IMPLANT
DRAIN CHANNEL 19F RND (DRAIN) ×3 IMPLANT
DRAPE ARM DVNC X/XI (DISPOSABLE) ×12 IMPLANT
DRAPE COLUMN DVNC XI (DISPOSABLE) ×3 IMPLANT
DRAPE DA VINCI XI ARM (DISPOSABLE) ×4
DRAPE DA VINCI XI COLUMN (DISPOSABLE) ×1
DRAPE SURG IRRIG POUCH 19X23 (DRAPES) ×4 IMPLANT
DRSG OPSITE POSTOP 4X10 (GAUZE/BANDAGES/DRESSINGS) IMPLANT
DRSG OPSITE POSTOP 4X6 (GAUZE/BANDAGES/DRESSINGS) ×1 IMPLANT
DRSG OPSITE POSTOP 4X8 (GAUZE/BANDAGES/DRESSINGS) IMPLANT
DRSG TEGADERM 2-3/8X2-3/4 SM (GAUZE/BANDAGES/DRESSINGS) ×20 IMPLANT
DRSG TEGADERM 4X4.75 (GAUZE/BANDAGES/DRESSINGS) ×3 IMPLANT
ELECT PENCIL ROCKER SW 15FT (MISCELLANEOUS) ×4 IMPLANT
ELECT REM PT RETURN 15FT ADLT (MISCELLANEOUS) ×4 IMPLANT
ENDOLOOP SUT PDS II  0 18 (SUTURE)
ENDOLOOP SUT PDS II 0 18 (SUTURE) IMPLANT
EVACUATOR SILICONE 100CC (DRAIN) ×3 IMPLANT
GAUZE SPONGE 2X2 8PLY STRL LF (GAUZE/BANDAGES/DRESSINGS) ×3 IMPLANT
GAUZE SPONGE 4X4 12PLY STRL (GAUZE/BANDAGES/DRESSINGS) IMPLANT
GLOVE BIO SURGEON STRL SZ7.5 (GLOVE) ×12 IMPLANT
GLOVE BIOGEL M STRL SZ7.5 (GLOVE) ×4 IMPLANT
GLOVE INDICATOR 8.0 STRL GRN (GLOVE) ×12 IMPLANT
GOWN STRL REUS W/TWL LRG LVL3 (GOWN DISPOSABLE) ×8 IMPLANT
GOWN STRL REUS W/TWL XL LVL3 (GOWN DISPOSABLE) ×20 IMPLANT
GRASPER SUT TROCAR 14GX15 (MISCELLANEOUS) ×4 IMPLANT
GUIDEWIRE ANG ZIPWIRE 038X150 (WIRE) ×4 IMPLANT
GUIDEWIRE STR DUAL SENSOR (WIRE) ×1 IMPLANT
HOLDER FOLEY CATH W/STRAP (MISCELLANEOUS) ×4 IMPLANT
IRRIG SUCT STRYKERFLOW 2 WTIP (MISCELLANEOUS) ×4
IRRIGATION SUCT STRKRFLW 2 WTP (MISCELLANEOUS) IMPLANT
KIT PROCEDURE DA VINCI SI (MISCELLANEOUS) ×1
KIT PROCEDURE DVNC SI (MISCELLANEOUS) ×3 IMPLANT
KIT TURNOVER KIT A (KITS) IMPLANT
MANIFOLD NEPTUNE II (INSTRUMENTS) ×4 IMPLANT
NDL INSUFFLATION 14GA 120MM (NEEDLE) ×3 IMPLANT
NEEDLE INSUFFLATION 14GA 120MM (NEEDLE) ×4 IMPLANT
PACK CARDIOVASCULAR III (CUSTOM PROCEDURE TRAY) ×4 IMPLANT
PACK COLON (CUSTOM PROCEDURE TRAY) ×4 IMPLANT
PACK CYSTO (CUSTOM PROCEDURE TRAY) ×4 IMPLANT
PAD POSITIONING PINK XL (MISCELLANEOUS) ×4 IMPLANT
PORT LAP GEL ALEXIS MED 5-9CM (MISCELLANEOUS) ×4 IMPLANT
PROTECTOR NERVE ULNAR (MISCELLANEOUS) ×8 IMPLANT
RELOAD STAPLE 45 BLU REG DVNC (STAPLE) IMPLANT
RELOAD STAPLE 45 GRN THCK DVNC (STAPLE) IMPLANT
RETRACTOR WND ALEXIS 18 MED (MISCELLANEOUS) IMPLANT
RTRCTR WOUND ALEXIS 18CM MED (MISCELLANEOUS)
SCISSORS LAP 5X35 DISP (ENDOMECHANICALS) ×4 IMPLANT
SEAL CANN UNIV 5-8 DVNC XI (MISCELLANEOUS) ×12 IMPLANT
SEAL XI 5MM-8MM UNIVERSAL (MISCELLANEOUS) ×4
SEALER VESSEL DA VINCI XI (MISCELLANEOUS) ×1
SEALER VESSEL EXT DVNC XI (MISCELLANEOUS) ×3 IMPLANT
SLEEVE ADV FIXATION 5X100MM (TROCAR) IMPLANT
SOLUTION ELECTROLUBE (MISCELLANEOUS) ×4 IMPLANT
SPONGE GAUZE 2X2 STER 10/PKG (GAUZE/BANDAGES/DRESSINGS) ×1
STAPLER 45 BLU RELOAD XI (STAPLE) IMPLANT
STAPLER 45 BLUE RELOAD XI (STAPLE)
STAPLER 45 GREEN RELOAD XI (STAPLE) ×2
STAPLER 45 GRN RELOAD XI (STAPLE) ×6 IMPLANT
STAPLER CANNULA SEAL DVNC XI (STAPLE) ×3 IMPLANT
STAPLER CANNULA SEAL XI (STAPLE) ×1
STAPLER ECHELON POWER CIR 29 (STAPLE) ×1 IMPLANT
STAPLER SHEATH (SHEATH) ×1
STAPLER SHEATH ENDOWRIST DVNC (SHEATH) ×3 IMPLANT
SURGILUBE 2OZ TUBE FLIPTOP (MISCELLANEOUS) ×4 IMPLANT
SUT MNCRL AB 4-0 PS2 18 (SUTURE) ×4 IMPLANT
SUT PDS AB 1 CT1 27 (SUTURE) ×2 IMPLANT
SUT PDS AB 1 CTX 36 (SUTURE) IMPLANT
SUT PDS AB 1 TP1 96 (SUTURE) IMPLANT
SUT PROLENE 0 CT 2 (SUTURE) IMPLANT
SUT PROLENE 2 0 KS (SUTURE) IMPLANT
SUT PROLENE 2 0 SH DA (SUTURE) IMPLANT
SUT SILK 2 0 (SUTURE) ×4
SUT SILK 2 0 SH CR/8 (SUTURE) ×2 IMPLANT
SUT SILK 2-0 18XBRD TIE 12 (SUTURE) IMPLANT
SUT SILK 3 0 (SUTURE) ×4
SUT SILK 3 0 SH CR/8 (SUTURE) ×4 IMPLANT
SUT SILK 3-0 18XBRD TIE 12 (SUTURE) ×3 IMPLANT
SUT V-LOC BARB 180 2/0GR6 GS22 (SUTURE)
SUT VIC AB 0 UR5 27 (SUTURE) ×1 IMPLANT
SUT VIC AB 3-0 SH 18 (SUTURE) IMPLANT
SUT VIC AB 3-0 SH 27 (SUTURE)
SUT VIC AB 3-0 SH 27XBRD (SUTURE) IMPLANT
SUT VICRYL 0 UR6 27IN ABS (SUTURE) ×4 IMPLANT
SUTURE V-LC BRB 180 2/0GR6GS22 (SUTURE) IMPLANT
SYR 10ML LL (SYRINGE) ×4 IMPLANT
SYS LAPSCP GELPORT 120MM (MISCELLANEOUS)
SYSTEM LAPSCP GELPORT 120MM (MISCELLANEOUS) IMPLANT
TAPE UMBILICAL COTTON 1/8X30 (MISCELLANEOUS) ×3 IMPLANT
TOWEL OR NON WOVEN STRL DISP B (DISPOSABLE) ×4 IMPLANT
TRAY FOLEY MTR SLVR 16FR STAT (SET/KITS/TRAYS/PACK) ×3 IMPLANT
TROCAR ADV FIXATION 5X100MM (TROCAR) ×4 IMPLANT
TUBING CONNECTING 10 (TUBING) ×12 IMPLANT
TUBING INSUFFLATION 10FT LAP (TUBING) ×4 IMPLANT

## 2018-12-08 NOTE — Op Note (Addendum)
12/08/2018  3:29 PM  PATIENT:  Kiara Olson  63 y.o. female  Patient Care Team: London Pepper, MD as PCP - General (Family Medicine)  PRE-OPERATIVE DIAGNOSIS:  Diverticular stricture  POST-OPERATIVE DIAGNOSIS:  Same  PROCEDURE:   1. Robotic assisted low anterior resection 2. Flexible sigmoidoscopy 3. Bilateral transversus abdominus plane blocks  SURGEON:  Sharon Mt. Dema Severin, MD  ASSISTANT: Michael Boston, MD  ANESTHESIA:   general  COUNTS:  Sponge, needle and instrument counts were reported correct x2 at the conclusion of the operation.  EBL: 25 mL  DRAINS: None  SPECIMEN: Rectosigmoid colon, open end is proximal  COMPLICATIONS: None  FINDINGS: Mid/distal sigmoid in pelvis adherent to proximal rectum from prior attacks of diverticulitis which necessitated a low anterior resection. Proximal and distal ends of colon and rectum were checked with ICG perfusion and demonstrated avid uptake of the tracer. Anastomosis created at 11 cm from the anal verge with a straightened sigmoidoscope - this was air tight, hemostatic, well perfused and tension free.  DISPOSITION: PACU in satisfactory condition  INDICATION: Ms. Failla is a very pleasant 63yo female with hx of hypothyroidism here today for surgery. Since 2011 she has had approximately 6-8 "attacks" of diverticulitis which have required antibiotics. None of these attacks have had complications requiring percutaneous drainage/abscess have been managed nonoperatively. She thinks she's had a couple more episodes which she is managed on her own at home. She underwent colonoscopy 08/2017 with Dr. Therisa Doyne and was found to have a benign appearing stricture approximately 15cm from the anal verge which was traversed with a pediatric scope. Pertinent findings include diverticulosis but an otherwise normal exam. She was subsequent referred to [place] for follow-up. Since her colonoscopy she denies any complaints. She denies any  obstructive symptoms and has formed bowel movements daily. She denies history of obstructive symptoms as well. Rarely she will have some issues with constipation that is alleviated with a single dose of milk of magnesia. She is taking daily Benefiber.  She had a CAT scan of her abdomen and pelvis 07/2017 which demonstrated findings consistent with acute uncomplicated sigmoid diverticulitis.  Since was seen in the office 09/2016 where she initially opted to further observe, she has had contiued issues with symptoms. She reported 1-2 bouts a month of crampy abdominal hypogastric pain. These last 1-3 days. In the past she was able to modify her diet with improvement of these frequencies of attack. Now, regardless of what she eats they seem to occur. Her last attack was in the last month. She denies pain elsewhere in her abdomen. She denies any recent issues of fever or chills. She reports having very soft/semi-formed stool daily. It's brown and there is no blood in stool. She denies any issues with nausea or vomiting. She is tolerating a diet.  She was scheduled for surgery at her last visit with [place] for August 2019 but subsequently backed out stating she had many events happened in her life including death and the family and that was not time for her surgery. She returns today to reschedule her procedure. She still having ongoing issues with cramping abdominal discomfort intermittently, and hypogastric region. Still happening 1-2 times per month and lasts one to 3 days and it occurs. She denies any recurrent attacks of fever/chills/nausea/vomiting. She has been having daily bowel movements that fluctuate from formed and hard to soft and watery. She is taking a daily fiber supplement and notes some improvement in her symptoms and she takes this reliably.  Options were discussed with her moving forward and she elected to proceed with surgery to address her diverticular stricture. Please  refer to notes elsewhere in the EMR for details regarding this discussion.  DESCRIPTION: The patient was identified in preop holding and taken to the OR where she was placed on the operating room table. SCDs were placed. General endotracheal anesthesia was induced without difficulty. She was then positioned in lithotomy with Allen stirrups. Hair on the abdomen was then clipped. Pressure points were then assessed and padded appropriately. She was then prepped and draped in the usual sterile fashion for cystoscopy/stents. Please refer to Dr. Zettie Pho note for details regarding this portion of the procedure. She was then prepped and draped in the usual sterile fashion for the abdominal portion. A surgical timeout was performed indicating the correct patient, procedure, positioning and need for preoperative antibiotics.   An OG tube was placed by anesthesia and confirmed to be to suction. At Palmer's point, a stab incision was made and the Veress needle introduced into the peritoneal cavity. Intraperitoneal location was confirmed with aspiration and the saline drop test. The abdomen was then insufflated to 15 mmHg with CO2. Following this, and stay well away from prior incisions, an 8 mm robotic port was carefully placed in the right upper abdomen. The laparoscope was inserted and inspection confirmed no evidence of Veress needle nor trocar site complications. Bilateral transversus abdominus plane blocks were created with a dilute mixture of Exparel and Marcaine. 3 additional 8 mm ports were placed in a line from the right ASIS to the left costal margin. A 5 mm assist port was placed in the right lower quadrant. All ports were placed under direct visualization. She was then positioned in trendelenburg with some left side up. The omentum was retracted into the upper abdomen. The small bowel was reflected out of the pelvis. The sigmoid was quite redundant in the pelvis with a true S shape. The sigmoidal attachments at  the intersigmoid fossa were taken down. The left ureter was readily identified and protected "down." Following this, dissection into the pelvis commenced. The sigmoid was somewhat adherent to the left pelvic side wall but again left ureter traced out and preserved free of injury. The sigmoid was also adherent to the proximal rectum. This was taken down sharply. After this had been mobilized, the dissection commenced cephalad. The rectosigmoid colon was elevated anteriorly.  The peritoneum overlying the sacral promontory was identified and incised.  The avascular plane between the fascia propria of the rectum and the presacral fascia was identified in the "TME" plane.  This dissection commenced cephalad and caudad.  Distally, this dissection terminated at the level of the proximal rectum just beneath the sacral promontory.  At this level, the rectum appeared healthy.  Cephalad, the plane was developed again sweeping the left ureter down.  Gonadals proximally were also preserved and protected.  The IMA was identified.  The peritoneum overlying it was incised.  The IMA was circumferentially dissected.  The IMV was also identified and circumferentially dissected.  The location of the left ureter was again confirmed to be far away from our plane dissection.  Following this and complete skeletonization, the IMA was ligated using the vessel sealer.  The stump was inspected and noted to be hemostatic.  The IMV was then ligated using the vessel sealer as it was quite short and tethering the descending colon.  The IMV stump was inspected and noted to be hemostatic. The descending mesocolon was mobilized  up onto the level of the left kidney.  Laterally, all attachments of the descending colon had been mobilized.  The descending colon was completely free and region of the pelvis where it remained without any tension.  Attention was turned to the distal point of division.  The healthy portion of the proximal rectum was  identified and the mesorectum at this location was cleared.  Care was taken to protect the rectum free of any injury.  The hypogastric nerves had been preserved.  Once the rectum at this level skeletonized, attention was turned to division.  A 12 mm trocar was placed 3 fingerbreadths above the pubic symphysis under direct visualization at the location of her prior abdominoplasty incision.  A 45 mm green load stapler was then passed and 2 firings necessary to completely divide the rectum.  Stapler was removed. At this level, the tenia had splayed, there were no diverticula nor epiploicae, and just below the sacral promontory. Attention was then turned to the proximal point of division.  At the location of the distal descending colon, the mesentery was ligated using the vessel sealer.  Attention was then turned to a perfusion test.  ICG was administered by anesthesia.  Using firefly, the distal descending colon was inspected and at the level of the divided mesentery, it was well perfused.  The rectal stump was inspected and noted to be well perfused as well.  The pelvis was irrigated.  Hemostasis was then confirmed.  The ureters, bladder, and vaginal cuff all appeared intact and free of injury.  The robot was then undocked.  At the location of the 12 mm low midline trocar, a Pfannenstiel incision was created.  The skin was incised sharply.  The subcutaneous tissue was divided with electrocautery.  The anterior rectus sheath was incised.  Kocher clamps were used to grasp the rectus sheath and attachments to the underlying rectus were taken down cephalad and caudad.  Hemostasis was then confirmed within the rectus sheath.  The midline peritoneum was incised.  An Clyde wound protector was placed.  The staple line was eviscerated through the wound protector.  The proximal point of division was identified where the mesentery had been cleared.  A pursestring clamp was applied at this location.  A 2-0 Prolene on a Keith  needle was then passed through the pursestring device.  The rectosigmoid colon was then divided and passed off the specimen with the open end being proximal.  The pursestring clamp was released and there was nice bright red bleeding at the level of division.  "Belt loops" of 3-0 silk sutures were placed on the pursestring.  EEA sizers were then passed.  A 29 mm EEA stapler was selected.  The anvil was placed and the pursestring tied.  Some fat at the level of the pursestring was cleared.  There were no diverticula at this location.  The colon at this level was clearly healthy.  This was placed back into the abdomen.  A yellow cap was placed on the Alexis.  Pneumoperitoneum was reestablished.  I then went below all my assistant work from above.  EEA sizers were gently passed up the rectum.  The 29 mm sizer passed nicely.  A 29 mm powered Ethicon EEA stapler was passed under direct visualization.  The spike was deployed just anterior to the staple line.  The components were mated.  Orientation of the descending colon was then confirmed such that there was no twisting of the colon/mesentery and that there is no  small bowel beneath the mesentery.  Stapler was then closed, held, and fired.  This was removed.  The donuts were inspected and noted to be complete.  The colon proximally anastomosis was clamped.  The pelvis was filled with sterile saline.  I then performed a flexible sigmoidoscopy.  The anastomosis was pink, patent, hemostatic, and airtight.  Looking from the abdominal side, there was no tension on the anastomosis.  The irrigation was then evacuated.  The sigmoidoscope was removed.  The anastomosis is located at 11 cm from the anal verge with a straightened sigmoidoscope.  All equipment was then passed off and new equipment, and gowns, gloves, oral utilized.  Additional sterile drapes were then placed around the surgical field.  Attention was turned to closing the abdomen.  The peritoneum at the Pfannenstiel  site was closed with a running 0 Vicryl suture.  The rectus fascia was then approximated using two #1 PDS sutures.  Additional anesthetic was infiltrated in the Pfannenstiel incision.  The skin of all incision sites was closed with 4-0 Monocryl subcuticular suture.  Dermabond was applied to all incision sites.  A honeycomb dressing was applied to the Pfannenstiel incision.  The stents were then removed and noted to be intact.  She was then taken out of lithotomy, placed supine on the operating table, awakened from general anesthesia, extubated, and transferred to a stretcher for transport to PACU in satisfactory condition

## 2018-12-08 NOTE — Discharge Instructions (Signed)
POST OP INSTRUCTIONS AFTER COLON SURGERY  1. DIET: Be sure to include lots of fluids daily to stay hydrated - 64oz of water per day (8, 8 oz glasses).  Avoid fast food or heavy meals for the first couple of weeks as your are more likely to get nauseated. Avoid raw/uncooked fruits or vegetables for the first 4 weeks (its ok to have these if they are blended into smoothie form). If you have fruits/vegetables, make sure they are cooked until soft enough to mash on the roof of your mouth and chew your food well. Otherwise, diet as tolerated.  2. Take your usually prescribed home medications unless otherwise directed.  3. PAIN CONTROL: a. Pain is best controlled by a usual combination of three different methods TOGETHER: i. Ice/Heat ii. Over the counter pain medication iii. Prescription pain medication b. Most patients will experience some swelling and bruising around the surgical site.  Ice packs or heating pads (30-60 minutes up to 6 times a day) will help. Some people prefer to use ice alone, heat alone, alternating between ice & heat.  Experiment to what works for you.  Swelling and bruising can take several weeks to resolve.   c. It is helpful to take an over-the-counter pain medication regularly for the first few weeks: i. Ibuprofen (Motrin/Advil) - 200mg tabs - take 3 tabs (600mg) every 6 hours as needed for pain (unless you have been directed previously to avoid NSAIDs/ibuprofen) ii. Acetaminophen (Tylenol) - you may take 650mg every 6 hours as needed. You can take this with motrin as they act differently on the body. If you are taking a narcotic pain medication that has acetaminophen in it, do not take over the counter tylenol at the same time. iii. NOTE: You may take both of these medications together - most patients  find it most helpful when alternating between the two (i.e. Ibuprofen at 6am, tylenol at 9am, ibuprofen at 12pm ...) d. A  prescription for pain medication should be given to you  upon discharge.  Take your pain medication as prescribed if your pain is not adequatly controlled with the over-the-counter pain reliefs mentioned above.  4. Avoid getting constipated.  Between the surgery and the pain medications, it is common to experience some constipation.  Increasing fluid intake and taking a fiber supplement (such as Metamucil, Citrucel, FiberCon, MiraLax, etc) 1-2 times a day regularly will usually help prevent this problem from occurring.  A mild laxative (prune juice, Milk of Magnesia, MiraLax, etc) should be taken according to package directions if there are no bowel movements after 48 hours.    5. Dressing: Your incisions are covered in Dermabond which is like sterile superglue for the skin. This will come off on it's own in a couple weeks. It is waterproof and you may bathe normally starting the day after your surgery in a shower. Avoid baths/pools/lakes/oceans until your wounds have fully healed.  6. ACTIVITIES as tolerated:   a. Avoid heavy lifting (>10lbs or 1 gallon of milk) for the next 6 weeks. b. You may resume regular daily activities as tolerated--such as daily self-care, walking, climbing stairs--gradually increasing activities as tolerated.  If you can walk 30 minutes without difficulty, it is safe to try more intense activity such as jogging, treadmill, bicycling, low-impact aerobics.  c. DO NOT PUSH THROUGH PAIN.  Let pain be your guide: If it hurts to do something, don't do it. d. You may drive when you are no longer taking prescription pain medication, you   can comfortably wear a seatbelt, and you can safely maneuver your car and apply brakes.  7. FOLLOW UP in our office a. Please call [facility] at [phone] to set up an appointment to see your surgeon in the office for a follow-up appointment approximately 2 weeks after your surgery. b. Make sure that you call for this appointment the day you arrive home to insure a convenient appointment time.  9. If you  have disability or family leave forms that need to be completed, you may have them completed by your primary care physician's office; for return to work instructions, please ask our office staff and they will be happy to assist you in obtaining this documentation   When to call us [phone]: 1. Poor pain control 2. Reactions / problems with new medications (rash/itching, etc)  3. Fever over 101.5 F (38.5 C) 4. Inability to urinate 5. Nausea/vomiting 6. Worsening swelling or bruising 7. Continued bleeding from incision. 8. Increased pain, redness, or drainage from the incision  The clinic staff is available to answer your questions during regular business hours (8:30am-5pm).  Please don't hesitate to call and ask to speak to one of our nurses for clinical concerns.   A surgeon from [facility] is always on call at the hospitals   If you have a medical emergency, go to the nearest emergency room or call 911.  [facility] [address], [address], [place], [place]  [address] MAIN: [phone] FAX: [phone] [email or web address] 

## 2018-12-08 NOTE — Anesthesia Procedure Notes (Signed)
Procedure Name: Intubation Date/Time: 12/08/2018 11:54 AM Performed by: Niel Hummer, CRNA Pre-anesthesia Checklist: Patient being monitored, Suction available, Emergency Drugs available and Patient identified Patient Re-evaluated:Patient Re-evaluated prior to induction Oxygen Delivery Method: Circle system utilized Preoxygenation: Pre-oxygenation with 100% oxygen Induction Type: IV induction Ventilation: Mask ventilation without difficulty Laryngoscope Size: Mac and 4 Grade View: Grade I Tube type: Oral Number of attempts: 1 Airway Equipment and Method: Stylet Placement Confirmation: ETT inserted through vocal cords under direct vision,  positive ETCO2 and breath sounds checked- equal and bilateral Secured at: 21 cm Tube secured with: Tape Dental Injury: Teeth and Oropharynx as per pre-operative assessment

## 2018-12-08 NOTE — H&P (Signed)
Kiara Olson is an 63 y.o. female.    Chief Complaint: Pre-OP Cysto, Bilateral Retrogrades / Stent Placement  HPI:   1  - Recurrent Diverticulitis - may episodes diverticulitis and now stricture undergoign sigmoid resection today. General surgery team has reqeusted peri-op ureteral stenting. CT with significant prior inflammation in left pelvis.  Today " Kiara Olson" is seen for sigmoid resection and peri-op stents. No interval fevers.   Past Medical History:  Diagnosis Date  . Alpha-1-antitrypsin deficiency (HCC)    Moderate level  . Aortic atherosclerosis ([place]) 2018  . Diverticulitis 2018  . GERD (gastroesophageal reflux disease)    Sleepers reflux  . Hypothyroidism   . Insomnia   . Macrocytic anemia   . Migraine   . PONV (postoperative nausea and vomiting)   . Seasonal allergies     Past Surgical History:  Procedure Laterality Date  . ABDOMINAL HYSTERECTOMY  1980  . ABDOMINOPLASTY  1989  . BREAST ENHANCEMENT SURGERY    . COLONOSCOPY     X3    No family history on file. Social History:  reports that she has quit smoking. Her smoking use included cigarettes. She has a 10.00 pack-year smoking history. She has never used smokeless tobacco. She reports current alcohol use. She reports that she does not use drugs.  Allergies:  Allergies  Allergen Reactions  . Lidocaine Other (See Comments)    Rash, sneezing fits, draining sinus     No medications prior to admission.    No results found for this or any previous visit (from the past 48 hour(s)). No results found.  Review of Systems  Constitutional: Negative for chills and fever.  All other systems reviewed and are negative.   There were no vitals taken for this visit. Physical Exam  Constitutional: She appears well-developed.  HENT:  Head: Normocephalic.  Eyes: Pupils are equal, round, and reactive to light.  Neck: Normal range of motion.  Cardiovascular: Normal rate.  Respiratory: Effort normal.  GI:  Few  scars w/o hernias.   Genitourinary:    Genitourinary Comments: NO CVAT.    Musculoskeletal: Normal range of motion.  Neurological: She is alert.  Skin: Skin is warm.     Assessment/Plan  Proceed as planned with cysto, bilateral retrogrades / stenting for ureteral ID and protection. Risks, benefits, expected peri-op course discussed.   Alexis Frock, MD 12/08/2018, 8:41 AM

## 2018-12-08 NOTE — Brief Op Note (Signed)
12/08/2018  12:34 PM  PATIENT:  Kiara Olson  63 y.o. female  PRE-OPERATIVE DIAGNOSIS:  symptomatic diverticular stricture  POST-OPERATIVE DIAGNOSIS:  * No post-op diagnosis entered *  PROCEDURE: Cysto, Bilateral retrogrades, Bilateral ureteral stent placement  SURGEON:      Alexis Frock, MD - Primary  PHYSICIAN ASSISTANT:   ASSISTANTS: none   ANESTHESIA:   general  EBL:  minimal   BLOOD ADMINISTERED:none  DRAINS: foley + rt ureteral stent + lt ureteral stent to gravity   LOCAL MEDICATIONS USED:  NONE  SPECIMEN:  No Specimen  DISPOSITION OF SPECIMEN:  N/A  COUNTS:  YES  TOURNIQUET:  * No tourniquets in log *  DICTATION: .Other Dictation: Dictation Number [id number]  PLAN OF CARE: remain in OR 2 for colon portion of surgery  PATIENT DISPOSITION:  as per above   Delay start of Pharmacological VTE agent (>24hrs) due to surgical blood loss or risk of bleeding: yes

## 2018-12-08 NOTE — Transfer of Care (Signed)
Immediate Anesthesia Transfer of Care Note  Patient: Kiara Olson  Procedure(s) Performed: XI ROBOTIC  LAPAROSCOPIC SIGMOID COLECTOMY (N/A Abdomen) FLEXIBLE SIGMOIDOSCOPY (N/A Rectum) CYSTOSCOPY WITH BILATERAL RETROGRADE PYELOGRAM/BILATERAL URETERAL STENT PLACEMENT (Bilateral Urethra)  Patient Location: PACU  Anesthesia Type:General  Level of Consciousness: awake, alert  and oriented  Airway & Oxygen Therapy: Patient Spontanous Breathing and Patient connected to face mask oxygen  Post-op Assessment: Report given to RN and Post -op Vital signs reviewed and stable  Post vital signs: Reviewed and stable  Last Vitals:  Vitals Value Taken Time  BP    Temp    Pulse 76 12/08/2018  3:49 PM  Resp 15 12/08/2018  3:49 PM  SpO2 100 % 12/08/2018  3:49 PM  Vitals shown include unvalidated device data.  Last Pain:  Vitals:   12/08/18 0946  TempSrc:   PainSc: 2          Complications: No apparent anesthesia complications

## 2018-12-08 NOTE — Anesthesia Preprocedure Evaluation (Signed)
Anesthesia Evaluation  Patient identified by MRN, date of birth, ID band Patient awake    Reviewed: Allergy & Precautions, H&P , NPO status , Patient's Chart, lab work & pertinent test results  History of Anesthesia Complications (+) PONV and history of anesthetic complications  Airway Mallampati: II   Neck ROM: full    Dental   Pulmonary former smoker,  Alpha-1-antitrypsin deficiency   breath sounds clear to auscultation       Cardiovascular negative cardio ROS   Rhythm:regular Rate:Normal     Neuro/Psych  Headaches,    GI/Hepatic GERD  ,  Endo/Other  Hypothyroidism   Renal/GU      Musculoskeletal   Abdominal   Peds  Hematology   Anesthesia Other Findings   Reproductive/Obstetrics                             Anesthesia Physical Anesthesia Plan  ASA: III  Anesthesia Plan: General   Post-op Pain Management:    Induction: Intravenous  PONV Risk Score and Plan: 4 or greater and Ondansetron, Dexamethasone, Midazolam and Treatment may vary due to age or medical condition  Airway Management Planned: Oral ETT  Additional Equipment:   Intra-op Plan:   Post-operative Plan: Extubation in OR  Informed Consent: I have reviewed the patients History and Physical, chart, labs and discussed the procedure including the risks, benefits and alternatives for the proposed anesthesia with the patient or authorized representative who has indicated his/her understanding and acceptance.       Plan Discussed with: CRNA, Anesthesiologist and Surgeon  Anesthesia Plan Comments:         Anesthesia Quick Evaluation

## 2018-12-08 NOTE — H&P (Signed)
CC: Recurrent diverticulitis; benign appearing stricture on colonoscopy; here today for surgeru  HPI: Kiara Olson is a very pleasant 63yo female with hx of hypothyroidism here today for surgery. Since 2011 she has had approximately 6-8 "attacks" of diverticulitis which have required antibiotics. None of these attacks have had complications requiring percutaneous drainage/abscess have been managed nonoperatively. She thinks she's had a couple more episodes which she is managed on her own at home. She underwent colonoscopy 08/2017 with Dr. Therisa Doyne and was found to have a benign appearing stricture approximately 15cm from the anal verge which was traversed with a pediatric scope. Pertinent findings include diverticulosis but an otherwise normal exam. She was subsequent referred to [place] for follow-up. Since her colonoscopy she denies any complaints. She denies any obstructive symptoms and has formed bowel movements daily. She denies history of obstructive symptoms as well. Rarely she will have some issues with constipation that is alleviated with a single dose of milk of magnesia. She is taking daily Benefiber.  She had a CAT scan of her abdomen and pelvis 07/2017 which demonstrated findings consistent with acute uncomplicated sigmoid diverticulitis.  Since was seen in the office 09/2016 where she initially opted to further observe, she has had contiued issues with symptoms. She reported 1-2 bouts a month of crampy abdominal hypogastric pain. These last 1-3 days. In the past she was able to modify her diet with improvement of these frequencies of attack. Now, regardless of what she eats they seem to occur. Her last attack was in the last month. She denies pain elsewhere in her abdomen. She denies any recent issues of fever or chills. She reports having very soft/semi-formed stool daily. It's brown and there is no blood in stool. She denies any issues with nausea or vomiting. She is  tolerating a diet.  She was scheduled for surgery at her last visit with [place] for August 2019 but subsequently backed out stating she had many events happened in her life including death and the family and that was not time for her surgery. She returns today to reschedule her procedure. She still having ongoing issues with cramping abdominal discomfort intermittently, and hypogastric region. Still happening 1-2 times per month and lasts one to 3 days and it occurs. She denies any recurrent attacks of fever/chills/nausea/vomiting. She has been having daily bowel movements that fluctuate from formed and hard to soft and watery. She is taking a daily fiber supplement and notes some improvement in her symptoms and she takes this reliably. She denies any changes in her health since she saw [place] last including any new medications or health problems. She can comfortably ascend 2 flights of stairs without stopping and denies any issues with chest pain or shortness of breath.  PMH: Hypothyroidism (well-controlled on Armour Thyroid) PSH: Hysterectomy via low midline incision in the 1980s; abdominoplasty 1989 FHx: Sister had breast cancer 2 years ago. Patient states her last mammogram was 2-3 years ago and she has no plans for any further mammograms due to discomfort and has elected to perform self exams Social: Denies use of tobacco/EtOH/drugs ROS: A comprehensive 10 system review of systems was completed with the patient and pertinent findings as noted above.   Past Medical History:  Diagnosis Date  . Alpha-1-antitrypsin deficiency (HCC)    Moderate level  . Aortic atherosclerosis ([place]) 2018  . Diverticulitis 2018  . GERD (gastroesophageal reflux disease)    Sleepers reflux  . Hypothyroidism   . Insomnia   . Macrocytic anemia   .  Migraine   . PONV (postoperative nausea and vomiting)   . Seasonal allergies     Past Surgical History:  Procedure Laterality Date  . ABDOMINAL HYSTERECTOMY   1980  . ABDOMINOPLASTY  1989  . BREAST ENHANCEMENT SURGERY    . COLONOSCOPY     X3    History reviewed. No pertinent family history.  Social:  reports that she has quit smoking. Her smoking use included cigarettes. She has a 10.00 pack-year smoking history. She has never used smokeless tobacco. She reports current alcohol use. She reports that she does not use drugs.  Allergies:  Allergies  Allergen Reactions  . Lidocaine Other (See Comments)    Rash, sneezing fits, draining sinus     Medications: I have reviewed the patient's current medications.  No results found for this or any previous visit (from the past 48 hour(s)).  No results found.  ROS - all of the below systems have been reviewed with the patient and positives are indicated with bold text General: chills, fever or night sweats Eyes: blurry vision or double vision ENT: epistaxis or sore throat Allergy/Immunology: itchy/watery eyes or nasal congestion Hematologic/Lymphatic: bleeding problems, blood clots or swollen lymph nodes Endocrine: temperature intolerance or unexpected weight changes Breast: new or changing breast lumps or nipple discharge Resp: cough, shortness of breath, or wheezing CV: chest pain or dyspnea on exertion GI: as per HPI GU: dysuria, trouble voiding, or hematuria MSK: joint pain or joint stiffness Neuro: TIA or stroke symptoms Derm: pruritus and skin lesion changes Psych: anxiety and depression  PE Blood pressure 128/61, pulse 93, temperature 97.8 F (36.6 C), temperature source Oral, resp. rate 16, height 5\' 2"  (1.575 m), weight 50.8 kg, SpO2 100 %. Constitutional: NAD; conversant; no deformities Eyes: Moist conjunctiva; no lid lag; anicteric; PERRL Neck: Trachea midline; no thyromegaly Lungs: Normal respiratory effort; no tactile fremitus CV: RRR; no palpable thrills; no pitting edema GI: Abd soft, NT/ND; no palpable hepatosplenomegaly MSK: Normal gait; no clubbing/cyanosis  Psychiatric: Appropriate affect; alert and oriented x3 Lymphatic: No palpable cervical or axillary lymphadenopathy  No results found for this or any previous visit (from the past 48 hour(s)).  No results found.  A/P: [name] is a very pleasant 63yo female here for reevaluation of recurrent attacks of uncompliacted sigmoid diverticulitis and has subsequently developed a symptomatic diverticular stricture  -Her symptoms have persisted and increased. Given her known sigmoidal stricture that was only traversed with the pediatric scope, I believe her attacks and discomfort are related to this. We discussed the possibility that there are other causes. -Given the above the known stricture, I continue to believe she will obtain meaningful benefit from a sigmoid colectomy. We discussed the possibility of a stoma should there be dramatic size mismatch or other intraoperative findings which dictate this to be necessary -We discussed options moving forward in the office - observation with risks of progression and continued symptoms; we also discussed surgical options - robotic/laparoscopic, possible open, sigmoidectomy with coloproctostomy, flexible sigmoidoscopy, cysto/stents (urology) given hx of diverticulitis and prior pelvic surgery; possible ostomy. She opted to pursue surgery. -The anatomy and physiology of the GI tract was discussed at length with the patient again today. The pathophysiology of diverticulitis and strictures was discussed at length as well. She has previously been provided with literature on this condition and the procedure -The planned procedure, material risks (including, but not limited to, pain, bleeding, infection, scarring, need for blood transfusion, damage to surrounding structures- blood vessels/nerves/viscus/organs, damage to  ureter, urine leak, leak from anastomosis, need for additional procedures, need for stoma which may be permanent, DVT/PE, hernia, recurrence,  worsening of pre-existing medical conditions, pneumonia, heart attack, stroke, death) benefits and alternatives to surgery were discussed at length. I noted a good probability that the procedure would help improve her symptoms. The patient's questions were answered to her satisfaction, she voiced understanding and elected to proceed with surgery. Additionally, we discussed typical postoperative expectations and the recovery process.  Kiara Olson, M.D. General and Colorectal Surgery [facility]

## 2018-12-09 LAB — CBC
HEMATOCRIT: 31.7 % — AB (ref 36.0–46.0)
HEMOGLOBIN: 10.1 g/dL — AB (ref 12.0–15.0)
MCH: 34.4 pg — ABNORMAL HIGH (ref 26.0–34.0)
MCHC: 31.9 g/dL (ref 30.0–36.0)
MCV: 107.8 fL — ABNORMAL HIGH (ref 80.0–100.0)
Platelets: 183 10*3/uL (ref 150–400)
RBC: 2.94 MIL/uL — ABNORMAL LOW (ref 3.87–5.11)
RDW: 12.7 % (ref 11.5–15.5)
WBC: 13.2 10*3/uL — ABNORMAL HIGH (ref 4.0–10.5)
nRBC: 0 % (ref 0.0–0.2)

## 2018-12-09 LAB — BASIC METABOLIC PANEL
Anion gap: 8 (ref 5–15)
BUN: 13 mg/dL (ref 8–23)
CHLORIDE: 103 mmol/L (ref 98–111)
CO2: 24 mmol/L (ref 22–32)
Calcium: 8.5 mg/dL — ABNORMAL LOW (ref 8.9–10.3)
Creatinine, Ser: 0.86 mg/dL (ref 0.44–1.00)
GFR calc Af Amer: >60 mL/min (ref >60)
GFR calc non Af Amer: >60 mL/min (ref >60)
Glucose, Bld: 119 mg/dL — ABNORMAL HIGH (ref 70–99)
Potassium: 4.2 mmol/L (ref 3.5–5.1)
Sodium: 135 mmol/L (ref 135–145)

## 2018-12-09 LAB — MAGNESIUM: Magnesium: 1.6 mg/dL — ABNORMAL LOW (ref 1.7–2.4)

## 2018-12-09 LAB — PHOSPHORUS: Phosphorus: 3.1 mg/dL (ref 2.5–4.6)

## 2018-12-09 MED ORDER — SODIUM CHLORIDE 0.9 % IV BOLUS
1000.0000 mL | Freq: Once | INTRAVENOUS | Status: AC
  Administered 2018-12-09: 1000 mL via INTRAVENOUS

## 2018-12-09 NOTE — Progress Notes (Signed)
PT Cancellation Note  Patient Details Name: Kiara Olson MRN: [id number] DOB: March 11, 1956   Cancelled Treatment:     Chart reviewed and spoke with pt and dtr. Pt has been ambulating around the unit with no difficulties. REviewed safety with bed mobility verbal and pt agrees and understands. No PT needs at this time., signing off.     Clide Dales 12/09/2018, 12:09 PM  Clide Dales, PT Acute Rehabilitation Services Pager: [phone] Office: [phone] 12/09/2018

## 2018-12-09 NOTE — Plan of Care (Signed)
Patient lying in bed this morning; c/o mild pain but not ready for pain medication currently. Will continue to monitor.

## 2018-12-09 NOTE — Progress Notes (Signed)
OT Screen Note  Patient Details Name: Kiara Olson MRN: [id number] DOB: 04-24-1956   Cancelled Treatment:    Reason Eval/Treat Not Completed: OT screened, no needs identified, will sign off. Met with pt and she reports she is at baseline for ADLs. She also reports she has been up and walking. No acute OT needs. Will sign off. Thank you.  [place], OTR/L Acute Rehab Pager: [phone] Office: [phone] 12/09/2018, 2:48 PM

## 2018-12-09 NOTE — Progress Notes (Signed)
1 Day Post-Op   Subjective/Chief Complaint: Felt light headed this am. BP down slightly. Giving fluid bolus   Objective: Vital signs in last 24 hours: Temp:  [97.7 F (36.5 C)-98.5 F (36.9 C)] 98 F (36.7 C) (03/14 0625) Pulse Rate:  [54-93] 56 (03/14 0829) Resp:  [8-17] 16 (03/14 0648) BP: (81-148)/(43-75) 85/50 (03/14 0829) SpO2:  [99 %-100 %] 99 % (03/14 0641) Weight:  [50.2 kg-50.8 kg] 50.2 kg (03/14 0625)    Intake/Output from previous day: 03/13 0701 - 03/14 0700 In: 2085 [P.O.:90; I.V.:1895; IV Piggyback:100] Out: 885 [Urine:860; Blood:25] Intake/Output this shift: No intake/output data recorded.  General appearance: alert and cooperative Resp: clear to auscultation bilaterally Cardio: regular rate and rhythm GI: soft, minimal tenderness. good bs. incision ok  Lab Results:  Recent Labs    12/09/18 0431  WBC 13.2*  HGB 10.1*  HCT 31.7*  PLT 183   BMET Recent Labs    12/09/18 0431  NA 135  K 4.2  CL 103  CO2 24  GLUCOSE 119*  BUN 13  CREATININE 0.86  CALCIUM 8.5*   PT/INR No results for input(s): LABPROT, INR in the last 72 hours. ABG No results for input(s): PHART, HCO3 in the last 72 hours.  Invalid input(s): PCO2, PO2  Studies/Results: Dg C-arm 1-60 Min-no Report  Result Date: 12/08/2018 Fluoroscopy was utilized by the requesting physician.  No radiographic interpretation.    Anti-infectives: Anti-infectives (From admission, onward)   Start     Dose/Rate Route Frequency Ordered Stop   12/08/18 1400  neomycin (MYCIFRADIN) tablet 1,000 mg  Status:  Discontinued     1,000 mg Oral 3 times per day 12/08/18 0935 12/08/18 0936   12/08/18 1400  metroNIDAZOLE (FLAGYL) tablet 1,000 mg  Status:  Discontinued     1,000 mg Oral 3 times per day 12/08/18 0935 12/08/18 0936   12/08/18 0945  cefoTEtan (CEFOTAN) 2 g in sodium chloride 0.9 % 100 mL IVPB     2 g 200 mL/hr over 30 Minutes Intravenous On call to O.R. 12/08/18 0935 12/08/18 1154       Assessment/Plan: s/p Procedure(s): XI ROBOTIC  LAPAROSCOPIC SIGMOID COLECTOMY (N/A) FLEXIBLE SIGMOIDOSCOPY (N/A) CYSTOSCOPY WITH BILATERAL RETROGRADE PYELOGRAM/BILATERAL URETERAL STENT PLACEMENT (Bilateral) Advance diet  Continue IVF for hydration Ambulate Pod 1  LOS: 1 day    Kiara Olson 12/09/2018

## 2018-12-09 NOTE — Op Note (Signed)
NAME: Kiara, Olson MEDICAL RECORD [id number] ACCOUNT [id number] DATE OF BIRTH:11-Apr-1956 FACILITY: [facility] LOCATION: [id number] PHYSICIAN:Alrick Cubbage, MD  OPERATIVE REPORT  DATE OF PROCEDURE:  12/08/2018  PREOPERATIVE DIAGNOSIS:  Recurrent diverticulitis.  PROCEDURE: 1.  Cystoscopy, bilateral retrograde pyelograms, interpretation. 2.  Bilateral ureteral stent placement.  ESTIMATED BLOOD LOSS:  Nil.  MEDICATIONS:  None.  SPECIMENS:  None.  FINDINGS:  Unremarkable bilateral pyelograms.  DRAINS:   1.  A 16-French Foley catheter to straight drain. 2.  Externalized right ureteral stent, green in color. 3.  Left externalized ureteral stent, yellow in color to [employer].  INDICATIONS:  The patient is a very pleasant 63 year old lady with history of recurrent diverticulitis times many episodes.  She is undergoing segmental resection under the care of general surgery team today.  Prior imaging has suggested significant  inflammatory response in the area of the deep pelvis, especially on the left side and they have requested perioperative stenting to aid in ureteral protection and identification.  Her imaging was reviewed.  She was felt to be a suitable candidate.   Informed consent was obtained and placed in medical record.  DESCRIPTION OF PROCEDURE:  The patient was identified.  Procedure being cystoscopy and bilateral stent placement confirmed.  Procedure timeout was performed.  Intravenous antibiotics administered.  General anesthesia induced.  The patient was placed into  a low lithotomy position, sterile field was created by prepping and draping the patient's vagina, introitus and proximal thighs using iodine.  Cystourethroscopy was performed using a 22-French rigid cystoscope.  Inspection of urinary bladder revealed no  diverticula, calcifications, papillary lesions.  There was mild cystocele.  The right ureteral orifice was cannulated with a green open-ended catheter  and right retrograde pyelogram was obtained.  Right retrograde pyelogram demonstrates a single right ureter and single system right kidney.  No filling defects or narrowing noted.  Retrograde pyelogram was performed with a 50/50 slurry of ICG and contrast.  The ZIPwire was advanced to lower pole and  the stent was advanced to the level of the upper pole and set aside.  Similarly, left retrograde pyelogram was obtained with a 5 French yellow colored stent.  Left retrograde pyelogram demonstrated a single left ureter single system left kidney.  No filling defects or narrowing noted.  A sensor wire was advanced to lower pole.  This opened ended stent was advanced in the same location.  Foley catheter was  placed free to straight drain 10 mL in the balloon and the externalized stents were fashioned at this using silk tie.  The externalized stents and the Foley catheter were connected to 3-way Greenberg adapter labeled appropriately right, left and Foley  and the procedure was then handed over to general surgery team.  The patient tolerated the procedure well.  No immediate complications.  The patient remained in OR 2 for the general surgery portion of the procedure today.  TN/NUANCE  D:12/08/2018 T:12/08/2018 JOB:[id number]

## 2018-12-09 NOTE — Progress Notes (Signed)
I have reviewed and concur with the student's documentation.  

## 2018-12-09 NOTE — Progress Notes (Signed)
Pt BP 88/43 with pulse 57, resp 16 by dinamap. RN retook with manual and got 82/54, pulse 67, resp 16, with pt sitting on side of bed. On-call Dr Marlou Starks notified and ordered 1 L NS bolus, and recheck. Will continue to monitor.

## 2018-12-10 ENCOUNTER — Inpatient Hospital Stay (HOSPITAL_COMMUNITY): Payer: Medicare HMO

## 2018-12-10 LAB — BASIC METABOLIC PANEL
Anion gap: 4 — ABNORMAL LOW (ref 5–15)
BUN: 13 mg/dL (ref 8–23)
CHLORIDE: 107 mmol/L (ref 98–111)
CO2: 28 mmol/L (ref 22–32)
Calcium: 8.4 mg/dL — ABNORMAL LOW (ref 8.9–10.3)
Creatinine, Ser: 0.94 mg/dL (ref 0.44–1.00)
GFR calc Af Amer: >60 mL/min (ref >60)
GFR calc non Af Amer: >60 mL/min (ref >60)
Glucose, Bld: 99 mg/dL (ref 70–99)
Potassium: 4.7 mmol/L (ref 3.5–5.1)
Sodium: 139 mmol/L (ref 135–145)

## 2018-12-10 LAB — URINALYSIS, ROUTINE W REFLEX MICROSCOPIC
Bilirubin Urine: NEGATIVE
GLUCOSE, UA: NEGATIVE mg/dL
Ketones, ur: NEGATIVE mg/dL
Nitrite: NEGATIVE
Protein, ur: NEGATIVE mg/dL
RBC / HPF: >50 RBC/hpf — ABNORMAL HIGH (ref 0–5)
Specific Gravity, Urine: 1.008 (ref 1.005–1.030)
WBC, UA: >50 WBC/hpf — ABNORMAL HIGH (ref 0–5)
pH: 7 (ref 5.0–8.0)

## 2018-12-10 LAB — CBC
HCT: 30.9 % — ABNORMAL LOW (ref 36.0–46.0)
HEMOGLOBIN: 9.8 g/dL — AB (ref 12.0–15.0)
MCH: 34.6 pg — ABNORMAL HIGH (ref 26.0–34.0)
MCHC: 31.7 g/dL (ref 30.0–36.0)
MCV: 109.2 fL — AB (ref 80.0–100.0)
Platelets: 156 10*3/uL (ref 150–400)
RBC: 2.83 MIL/uL — ABNORMAL LOW (ref 3.87–5.11)
RDW: 13.1 % (ref 11.5–15.5)
WBC: 6.8 10*3/uL (ref 4.0–10.5)
nRBC: 0 % (ref 0.0–0.2)

## 2018-12-10 LAB — MAGNESIUM: Magnesium: 1.9 mg/dL (ref 1.7–2.4)

## 2018-12-10 LAB — PHOSPHORUS: Phosphorus: 1.4 mg/dL — ABNORMAL LOW (ref 2.5–4.6)

## 2018-12-10 IMAGING — US RENAL/URINARY TRACT ULTRASOUND
1 series · 14 of 25 positions shown · non-contrast
Comparison: [DATE].  [DATE].

CLINICAL DATA: Acute right-sided flank pain. Recent ureteral
stents.

EXAM:
RENAL / URINARY TRACT ULTRASOUND COMPLETE

[Series 1: renal/urinary tract ultrasound · 14 of 35 slices shown]
[im 1/35]
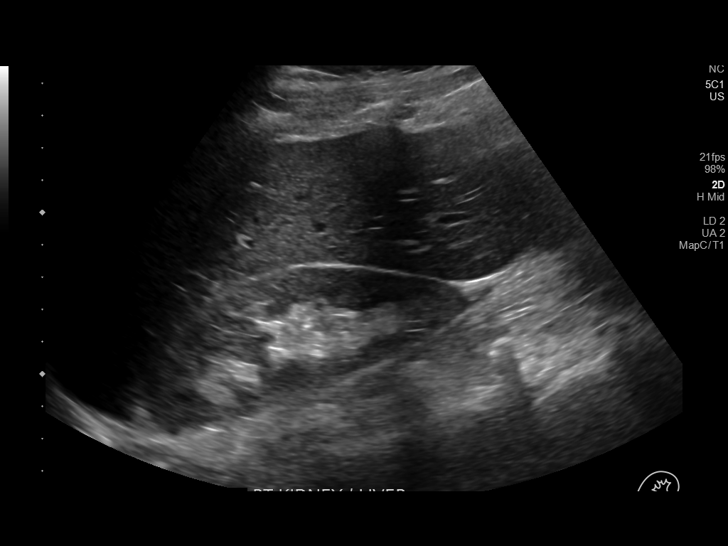
[im 3/35]
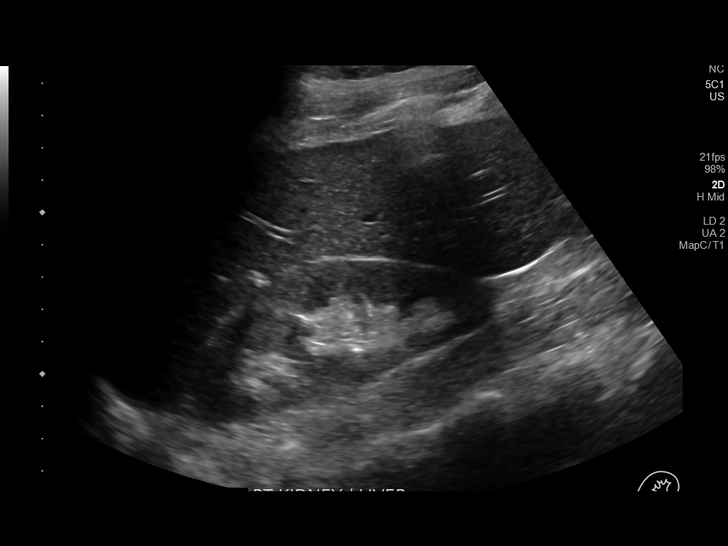
[im 6/35]
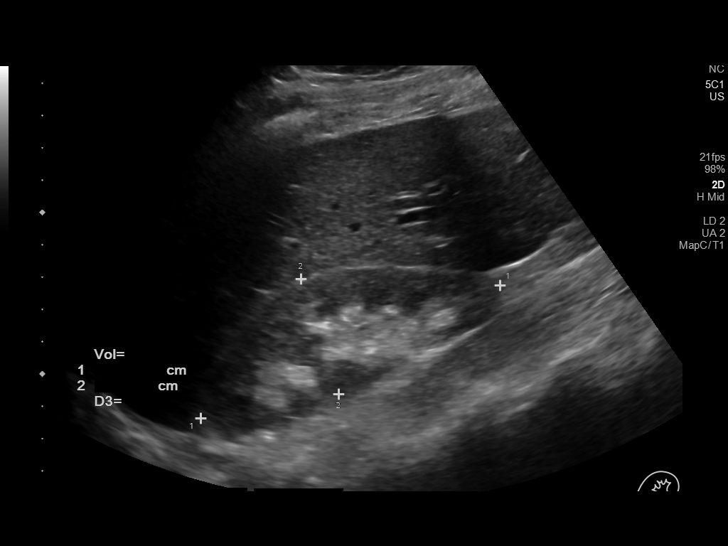
[im 9/35]
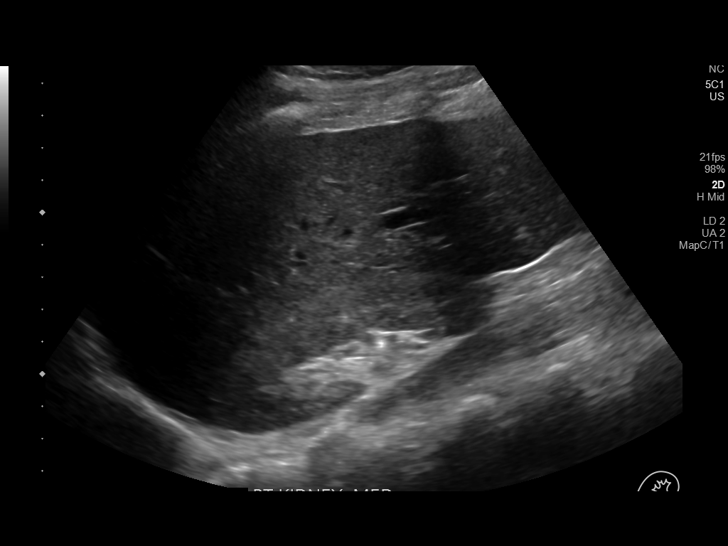
[im 12/35]
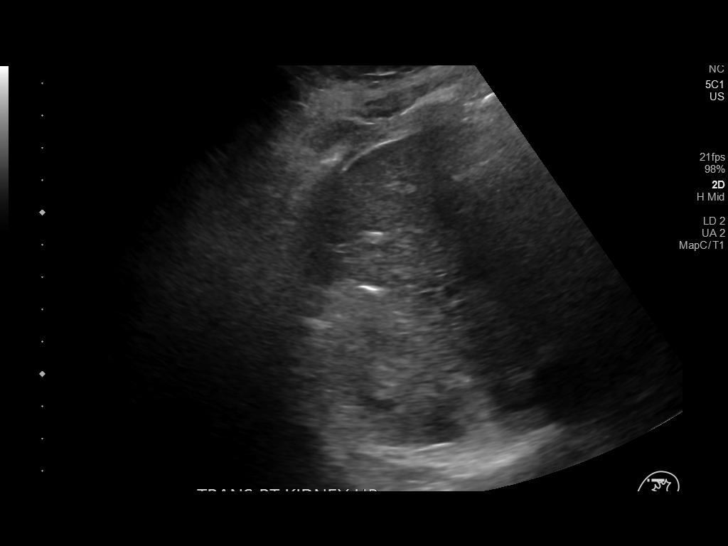
[im 13/35]
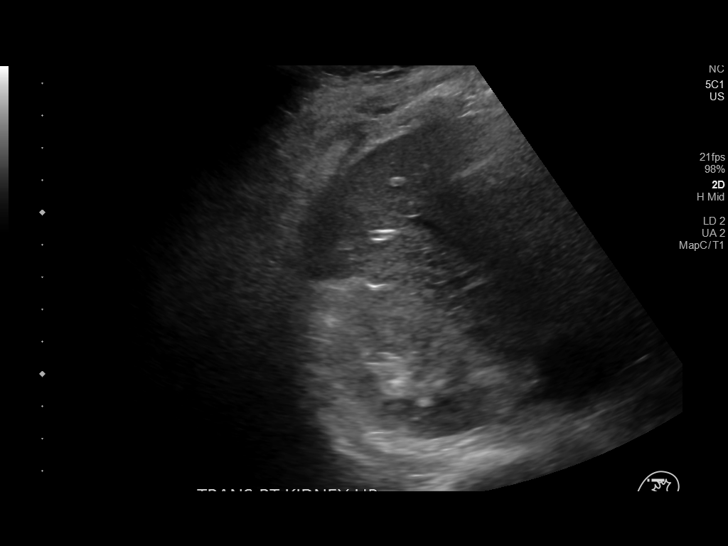
[im 16/35]
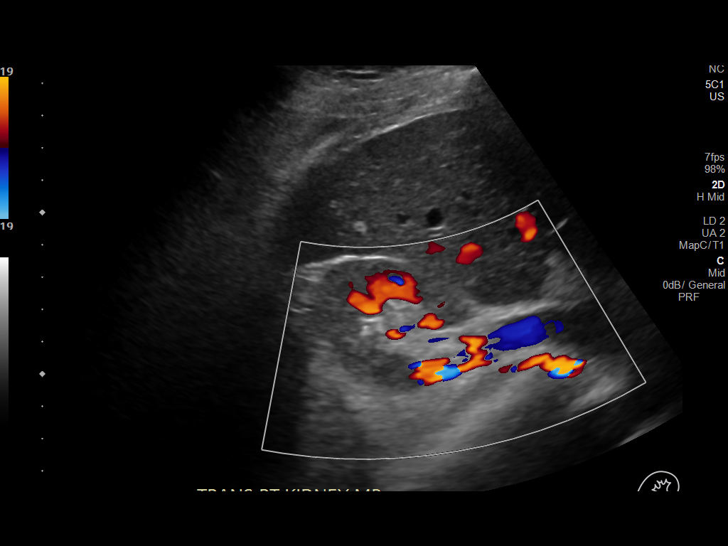
[im 19/35]
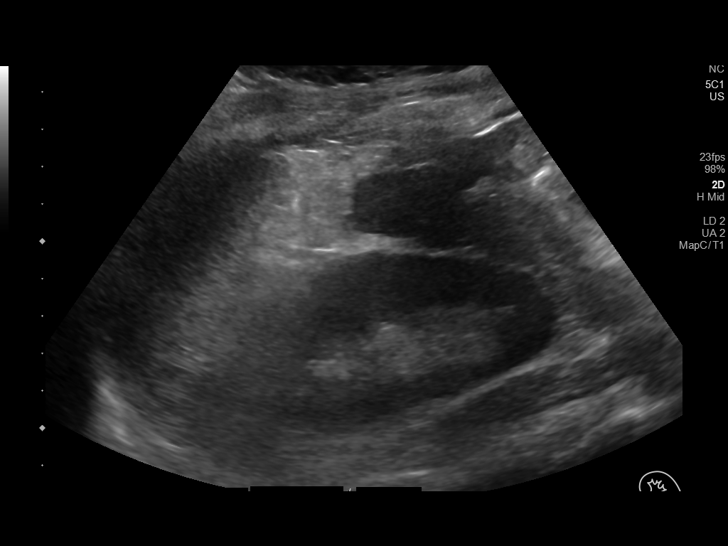
[im 22/35]
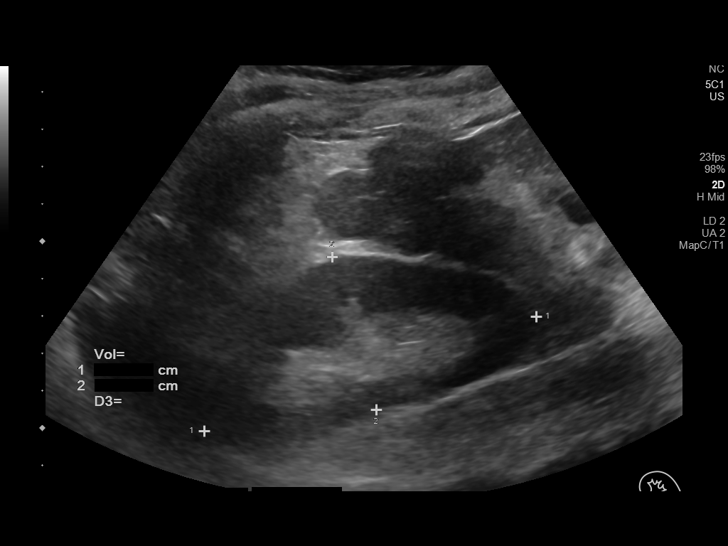
[im 23/35]
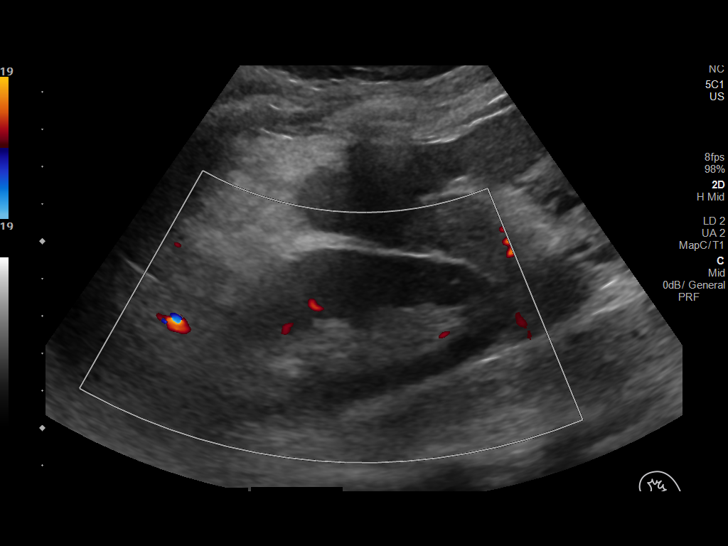
[im 26/35]
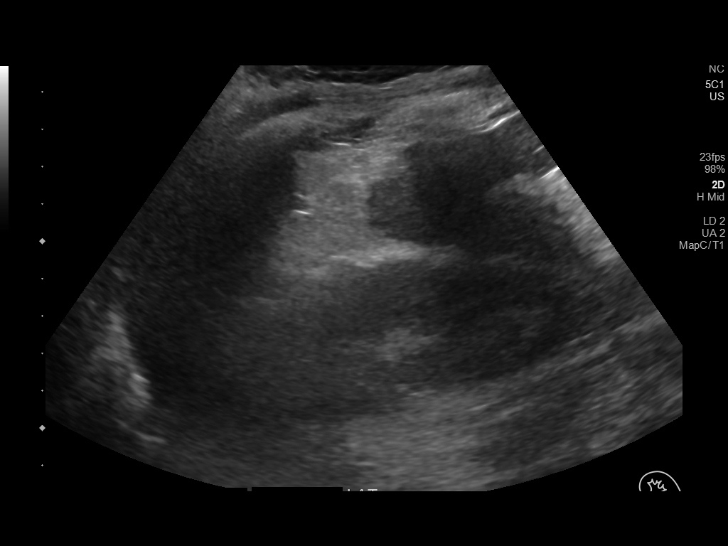
[im 29/35]
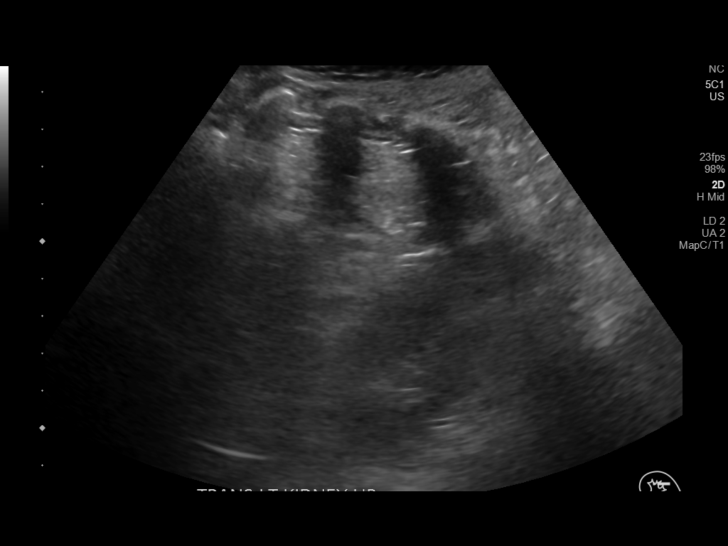
[im 32/35]
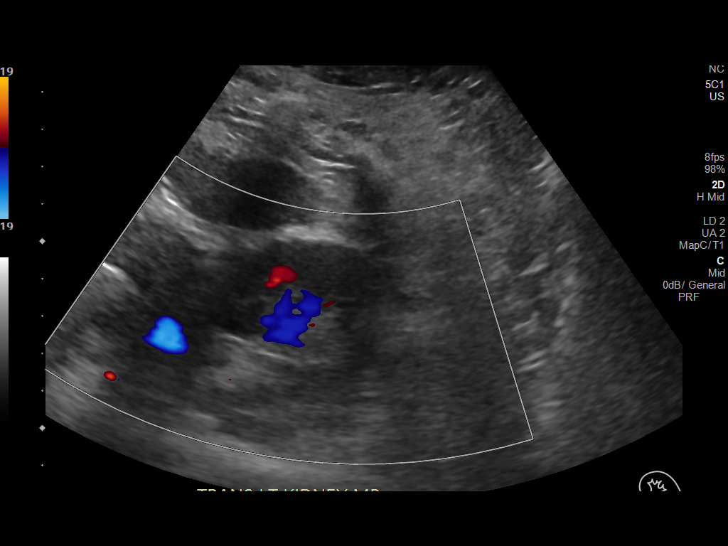
[im 35/35]
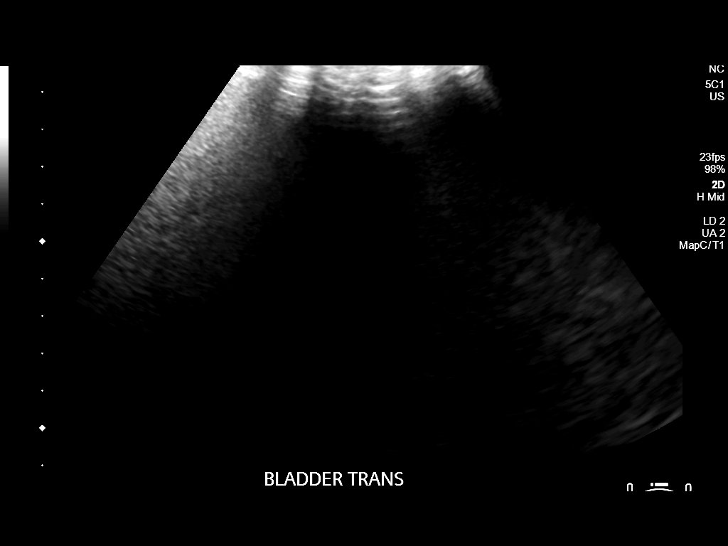

[14 of 25 positions shown; findings below may reference images not displayed]

FINDINGS: Right Kidney:

Renal measurements: 10.1 x 3.8 x 4.2 cm = volume: 82 mL. Normal
echogenicity. No hydronephrosis or hydroureter. No sign of stone by
ultrasound.

Left Kidney:

Renal measurements: 9.4 x 4.3 x 3.8 cm = volume: 78 mL. Normal
echogenicity. No hydroureteronephrosis.

Bladder:

Bladder not visible because of overlying surgical approach and
dressing.
IMPRESSION: The kidneys are slightly small, but do not show any
hydroureteronephrosis.

## 2018-12-10 MED ORDER — OXYCODONE HCL 5 MG PO TABS
5.0000 mg | ORAL_TABLET | Freq: Four times a day (QID) | ORAL | Status: DC | PRN
  Administered 2018-12-10 – 2018-12-11 (×5): 5 mg via ORAL
  Filled 2018-12-10 (×5): qty 1

## 2018-12-10 NOTE — Anesthesia Postprocedure Evaluation (Signed)
Anesthesia Post Note  Patient: Kiara Olson  Procedure(s) Performed: XI ROBOTIC  LAPAROSCOPIC SIGMOID COLECTOMY (N/A Abdomen) FLEXIBLE SIGMOIDOSCOPY (N/A Rectum) CYSTOSCOPY WITH BILATERAL RETROGRADE PYELOGRAM/BILATERAL URETERAL STENT PLACEMENT (Bilateral Urethra)     Patient location during evaluation: PACU Anesthesia Type: General Level of consciousness: awake and alert Pain management: pain level controlled Vital Signs Assessment: post-procedure vital signs reviewed and stable Respiratory status: spontaneous breathing, nonlabored ventilation, respiratory function stable and patient connected to nasal cannula oxygen Cardiovascular status: blood pressure returned to baseline and stable Postop Assessment: no apparent nausea or vomiting Anesthetic complications: no    Last Vitals:  Vitals:   12/09/18 2112 12/10/18 0611  BP: (!) 106/44 (!) 114/53  Pulse: 68 66  Resp: 18 18  Temp: 37 C 36.8 C  SpO2: 100% 100%    Last Pain:  Vitals:   12/10/18 0800  TempSrc:   PainSc: 8                  Kamill Fulbright S

## 2018-12-10 NOTE — Progress Notes (Signed)
2 Days Post-Op   Subjective/Chief Complaint: Patient has developed right flank pain Foley removed yesterday - no gross hematuria No significant abdominal pain Had BM yesterday Soft diet   Objective: Vital signs in last 24 hours: Temp:  [98.3 F (36.8 C)-98.6 F (37 C)] 98.3 F (36.8 C) (03/15 0611) Pulse Rate:  [66-72] 66 (03/15 0611) Resp:  [16-18] 18 (03/15 0611) BP: (87-114)/(44-53) 114/53 (03/15 0611) SpO2:  [98 %-100 %] 100 % (03/15 2637) Weight:  [50.1 kg] 50.1 kg (03/15 0000) Last BM Date: 12/09/18  Intake/Output from previous day: 03/14 0701 - 03/15 0700 In: 2504 [P.O.:960; I.V.:1424] Out: 8588 [Urine:3220; Stool:1] Intake/Output this shift: No intake/output data recorded.  General appearance: alert, cooperative and no distress Resp: clear to auscultation bilaterally Cardio: regular rate and rhythm, S1, S2 normal, no murmur, click, rub or gallop GI: + BS, soft, tender around incisions; R flank tenderness Incisions c/d/i  Lab Results:  Recent Labs    12/09/18 0431 12/10/18 0405  WBC 13.2* 6.8  HGB 10.1* 9.8*  HCT 31.7* 30.9*  PLT 183 156   BMET Recent Labs    12/09/18 0431 12/10/18 0405  NA 135 139  K 4.2 4.7  CL 103 107  CO2 24 28  GLUCOSE 119* 99  BUN 13 13  CREATININE 0.86 0.94  CALCIUM 8.5* 8.4*   PT/INR No results for input(s): LABPROT, INR in the last 72 hours. ABG No results for input(s): PHART, HCO3 in the last 72 hours.  Invalid input(s): PCO2, PO2  Studies/Results: Dg C-arm 1-60 Min-no Report  Result Date: 12/08/2018 Fluoroscopy was utilized by the requesting physician.  No radiographic interpretation.    Anti-infectives: Anti-infectives (From admission, onward)   Start     Dose/Rate Route Frequency Ordered Stop   12/08/18 1400  neomycin (MYCIFRADIN) tablet 1,000 mg  Status:  Discontinued     1,000 mg Oral 3 times per day 12/08/18 0935 12/08/18 0936   12/08/18 1400  metroNIDAZOLE (FLAGYL) tablet 1,000 mg  Status:   Discontinued     1,000 mg Oral 3 times per day 12/08/18 0935 12/08/18 0936   12/08/18 0945  cefoTEtan (CEFOTAN) 2 g in sodium chloride 0.9 % 100 mL IVPB     2 g 200 mL/hr over 30 Minutes Intravenous On call to O.R. 12/08/18 0935 12/08/18 1154      Assessment/Plan:  Diverticular stricture  s/p Procedure(s): XI ROBOTIC  LAPAROSCOPIC SIGMOID COLECTOMY (N/A) FLEXIBLE SIGMOIDOSCOPY (N/A) CYSTOSCOPY WITH BILATERAL RETROGRADE PYELOGRAM/BILATERAL URETERAL STENT PLACEMENT (Bilateral) Dr. Dema Severin Dr. Tresa Moore 12/08/18  Right flank pain - ?urinary tract/ ?ureteral obstruction from blood clot? Adjust pain meds Right renal ultrasound      LOS: 2 days    Kiara Olson 12/10/2018

## 2018-12-11 ENCOUNTER — Encounter (HOSPITAL_COMMUNITY): Payer: Self-pay | Admitting: Surgery

## 2018-12-11 LAB — BASIC METABOLIC PANEL
Anion gap: 5 (ref 5–15)
BUN: 9 mg/dL (ref 8–23)
CO2: 27 mmol/L (ref 22–32)
Calcium: 8.3 mg/dL — ABNORMAL LOW (ref 8.9–10.3)
Chloride: 108 mmol/L (ref 98–111)
Creatinine, Ser: 0.76 mg/dL (ref 0.44–1.00)
GFR calc Af Amer: >60 mL/min (ref >60)
GFR calc non Af Amer: >60 mL/min (ref >60)
Glucose, Bld: 102 mg/dL — ABNORMAL HIGH (ref 70–99)
POTASSIUM: 4 mmol/L (ref 3.5–5.1)
Sodium: 140 mmol/L (ref 135–145)

## 2018-12-11 LAB — PHOSPHORUS: Phosphorus: 2.6 mg/dL (ref 2.5–4.6)

## 2018-12-11 LAB — MAGNESIUM: Magnesium: 1.8 mg/dL (ref 1.7–2.4)

## 2018-12-11 MED ORDER — HYDROCODONE-ACETAMINOPHEN 5-325 MG PO TABS: 1.0000 | ORAL_TABLET | Freq: Four times a day (QID) | ORAL | 0 refills | Status: AC | PRN

## 2018-12-11 NOTE — Progress Notes (Signed)
Writer explained discharge instructions to patient and daughter. Patient or daughter had no questions. Writer and NT will wheel patient to the front of the building

## 2018-12-11 NOTE — Discharge Summary (Signed)
Patient ID: JAMESYN MOOREFIELD MRN: [id number] DOB/AGE: May 08, 1956 63 y.o.  Admit date: 12/08/2018 Discharge date: 12/11/2018  Discharge Diagnoses Patient Active Problem List   Diagnosis Date Noted  . S/P laparoscopic-assisted sigmoidectomy 12/08/2018    Consultants None  Procedures 1. Robotic assisted low anterior resection 2. Flexible sigmoidoscopy 3. Bilateral transversus abdominus plane blocks  Hospital Course: She was admitted to the hospital postoperatively where she recovered well. She began having bowel function on POD#1. Her diet was advanced. On POD#2, she was having bowel movements and tolerating a diet but developed a sharp shooting right flank pain. This resolved after a brief time and has not come back. A renal ultrasound was obtained and normal. Her creatinine was normal. She was voiding well on her own. On POD#3, she continued to tolerate a diet and have bowel function. Options were discussed and she stated she was ready to go home. She was deemed stable for discharge  AF VS normal General: NAD, comfortable CV: RRR GI: Abdomen is soft, NT/ND; incisions are healing well without erythema; dressings removed Ext: WWP without edema  Allergies as of 12/11/2018      Reactions   Lidocaine Other (See Comments)   Rash, sneezing fits, draining sinus       Medication List    TAKE these medications   Armour Thyroid 30 MG tablet Generic drug:  thyroid Take 30 mg by mouth every morning.   B-12 2500 MCG Subl Place 2,500 mcg under the tongue daily.   Biotin 10000 MCG Tabs Take by mouth.   fluticasone 50 MCG/ACT nasal spray Commonly known as:  FLONASE Place 1 spray into both nostrils daily as needed for allergies or rhinitis.   HYDROcodone-acetaminophen 5-325 MG tablet Commonly known as:  NORCO/VICODIN Take 1 tablet by mouth every 6 (six) hours as needed for up to 7 days (severe postop pain not controlled with tylenol and ibuprofen).   LORazepam 1 MG tablet Commonly  known as:  ATIVAN Take 1.5 mg by mouth at bedtime.   OVER THE COUNTER MEDICATION Apply 1 application topically daily. Magnesium oil   POTASSIMIN PO Place 10 drops under the tongue daily.   VITAMIN K2-VITAMIN D3 PO Place 2.5 drops under the tongue daily.        Follow-up Information    Ileana Roup, MD Follow up in 2 week(s).   Specialty:  General Surgery Contact information: [place] [address] [phone]           Lisia Westbay M. Dema Severin, M.D. [facility]

## 2018-12-11 NOTE — Progress Notes (Signed)
   12/11/18 1000  Clinical Encounter Type  Visited With Family  Visit Type Initial;Psychological support;Spiritual support  Referral From Nurse  Consult/Referral To Chaplain  Spiritual Encounters  Spiritual Needs Other (Comment);[employer] researcher )  Stress Factors  Patient Stress Factors Not reviewed  Family Stress Factors None identified   I visited with the patient's relative who was present at the bedside. The patient was in the bathroom getting dressed to go home.  I provided the Advance Directive document for the patient and explained that it could be completed at any time.   Please, contact Spiritual Care for further assistance.   Chaplain Shanon Ace M.Div., [facility]

## 2019-05-18 DIAGNOSIS — Z8719 Personal history of other diseases of the digestive system: Secondary | ICD-10-CM | POA: Diagnosis not present

## 2019-05-18 DIAGNOSIS — Z23 Encounter for immunization: Secondary | ICD-10-CM | POA: Diagnosis not present

## 2019-05-18 DIAGNOSIS — Z0000 Encounter for general adult medical examination without abnormal findings: Secondary | ICD-10-CM | POA: Diagnosis not present

## 2019-05-18 DIAGNOSIS — E785 Hyperlipidemia, unspecified: Secondary | ICD-10-CM | POA: Diagnosis not present

## 2019-05-18 DIAGNOSIS — E039 Hypothyroidism, unspecified: Secondary | ICD-10-CM | POA: Diagnosis not present

## 2019-05-18 DIAGNOSIS — H6122 Impacted cerumen, left ear: Secondary | ICD-10-CM | POA: Diagnosis not present

## 2019-09-03 IMAGING — CR DG LUMBAR SPINE 2-3V
3 series · 3 of 3 positions shown · non-contrast
Comparison: None.

CLINICAL DATA: Status post fall.

EXAM:
LUMBAR SPINE - 2-3 VIEW

[t l-spine a.p.]
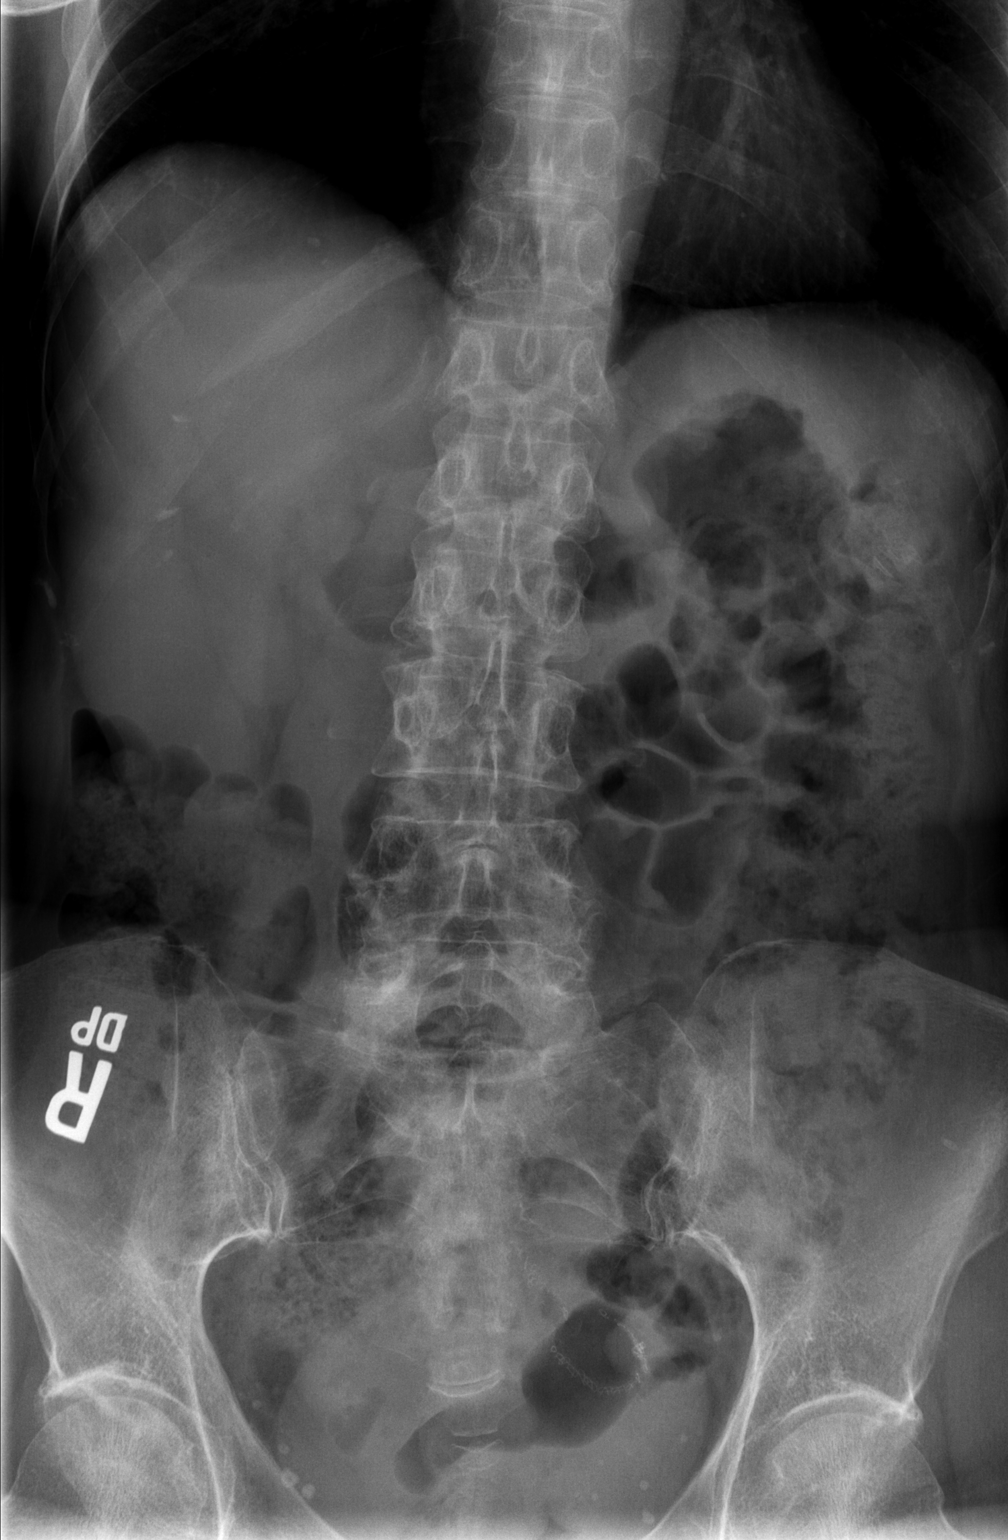

[t l-spine lat]
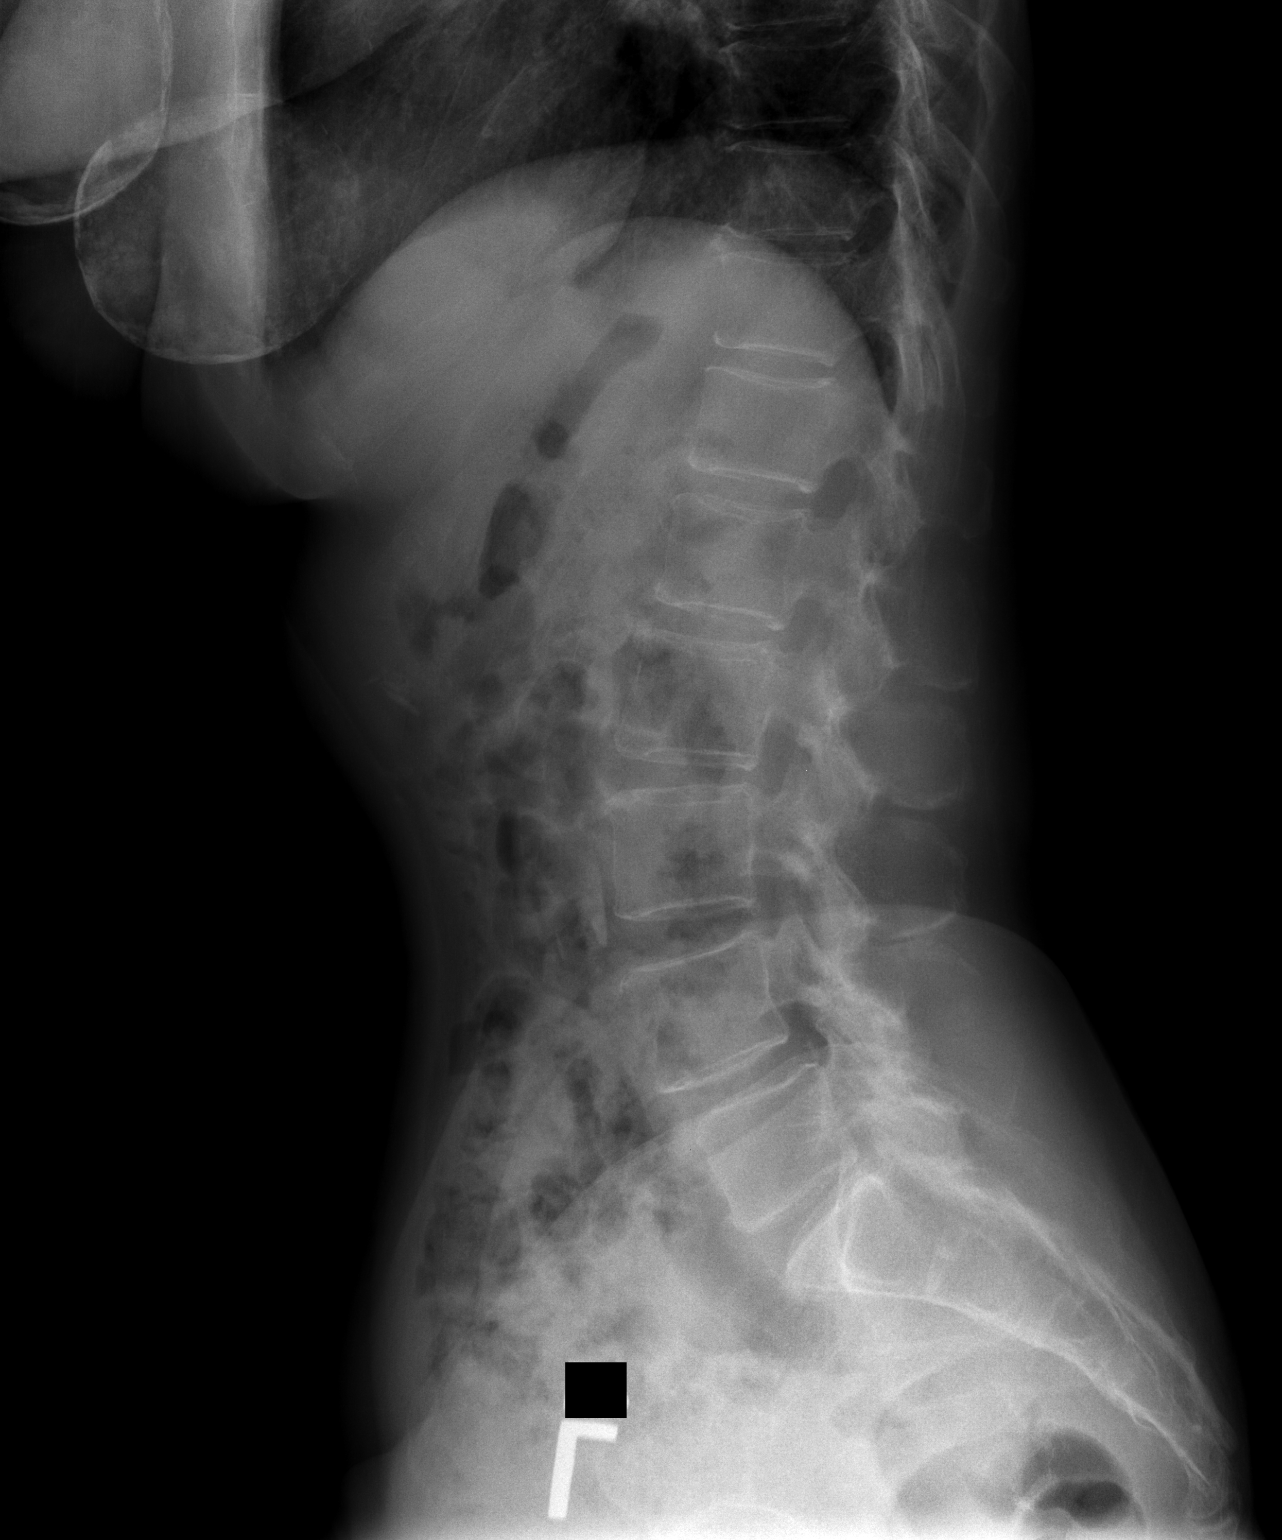

[t l-spine l5-s1 spot]
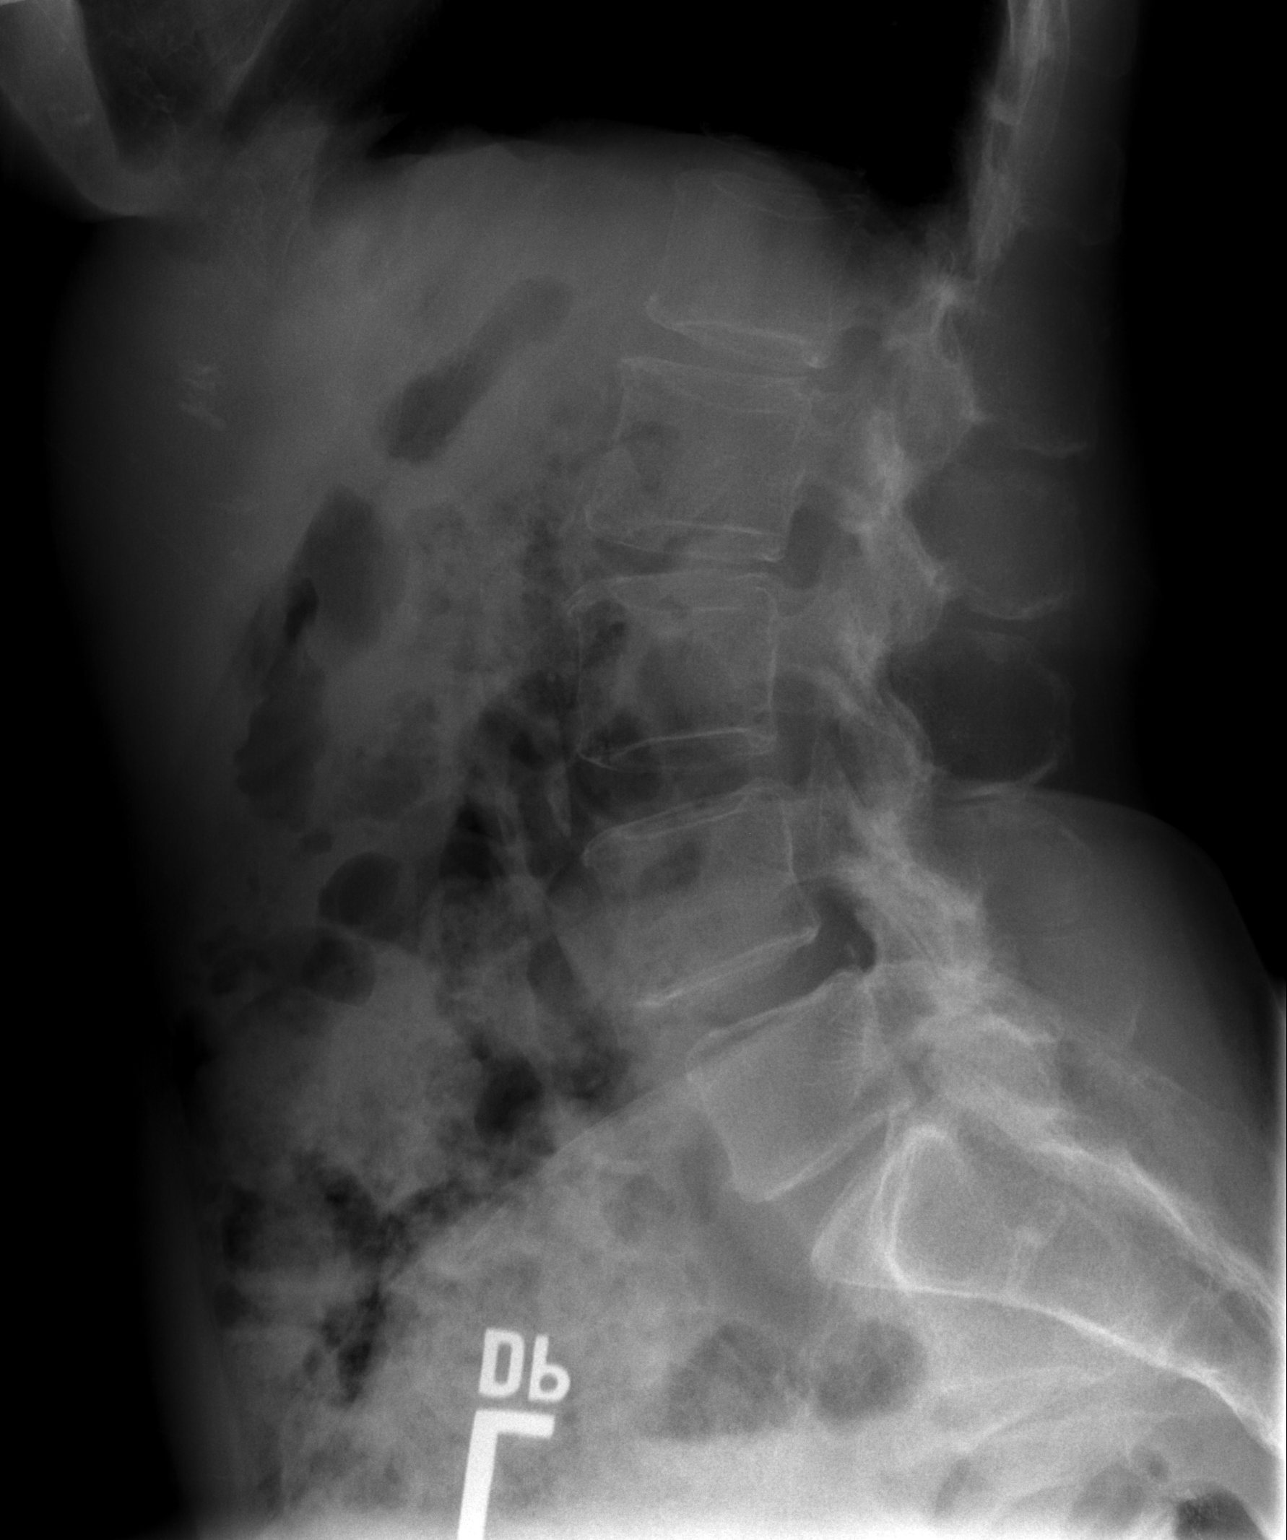

[3 of 3 positions shown; findings below may reference images not displayed]

FINDINGS: There is no evidence of lumbar spine fracture. There is
approximately 2 mm anterolisthesis of the L4 on L5 vertebral body.
Mild multilevel endplate sclerosis is seen. Mild multilevel
intervertebral disc space narrowing is noted. Intervertebral disc
spaces are maintained.
IMPRESSION: 1. Mild multilevel degenerative changes.
2. Approximately 2 mm anterolisthesis of L4 on L5 vertebral body.

## 2019-10-10 DIAGNOSIS — M545 Low back pain: Secondary | ICD-10-CM | POA: Diagnosis not present

## 2019-10-10 DIAGNOSIS — M4316 Spondylolisthesis, lumbar region: Secondary | ICD-10-CM | POA: Diagnosis not present

## 2019-10-10 DIAGNOSIS — Z8719 Personal history of other diseases of the digestive system: Secondary | ICD-10-CM | POA: Diagnosis not present

## 2019-10-10 DIAGNOSIS — Y939 Activity, unspecified: Secondary | ICD-10-CM | POA: Diagnosis not present

## 2019-10-10 DIAGNOSIS — W11XXXA Fall on and from ladder, initial encounter: Secondary | ICD-10-CM | POA: Diagnosis not present

## 2019-10-10 DIAGNOSIS — M47816 Spondylosis without myelopathy or radiculopathy, lumbar region: Secondary | ICD-10-CM | POA: Diagnosis not present

## 2019-10-10 DIAGNOSIS — E039 Hypothyroidism, unspecified: Secondary | ICD-10-CM | POA: Diagnosis not present

## 2019-10-10 DIAGNOSIS — M549 Dorsalgia, unspecified: Secondary | ICD-10-CM | POA: Diagnosis not present

## 2019-10-10 DIAGNOSIS — M8588 Other specified disorders of bone density and structure, other site: Secondary | ICD-10-CM | POA: Diagnosis not present

## 2019-10-10 DIAGNOSIS — R69 Illness, unspecified: Secondary | ICD-10-CM | POA: Diagnosis not present

## 2019-10-10 DIAGNOSIS — M5136 Other intervertebral disc degeneration, lumbar region: Secondary | ICD-10-CM | POA: Diagnosis not present

## 2019-10-10 DIAGNOSIS — Y92008 Other place in unspecified non-institutional (private) residence as the place of occurrence of the external cause: Secondary | ICD-10-CM | POA: Diagnosis not present

## 2019-10-16 ENCOUNTER — Other Ambulatory Visit: Payer: Self-pay | Admitting: Family Medicine

## 2019-10-16 ENCOUNTER — Ambulatory Visit
Admission: RE | Admit: 2019-10-16 | Discharge: 2019-10-16 | Disposition: A | Discharge status: Home / Self Care | Payer: Medicare HMO | Source: Ambulatory Visit | Attending: Family Medicine | Admitting: Family Medicine

## 2019-10-16 DIAGNOSIS — M533 Sacrococcygeal disorders, not elsewhere classified: Secondary | ICD-10-CM

## 2019-10-16 DIAGNOSIS — W19XXXA Unspecified fall, initial encounter: Secondary | ICD-10-CM | POA: Diagnosis not present

## 2019-10-16 DIAGNOSIS — Z23 Encounter for immunization: Secondary | ICD-10-CM | POA: Diagnosis not present

## 2019-10-16 DIAGNOSIS — S3992XA Unspecified injury of lower back, initial encounter: Secondary | ICD-10-CM | POA: Diagnosis not present

## 2019-10-16 DIAGNOSIS — S322XXA Fracture of coccyx, initial encounter for closed fracture: Secondary | ICD-10-CM | POA: Diagnosis not present

## 2019-10-16 IMAGING — CR DG SACRUM/COCCYX 2+V
3 series · 3 of 3 positions shown · non-contrast
Comparison: None.

CLINICAL DATA: Status post fall.

EXAM:
SACRUM AND COCCYX - 2+ VIEW

[t sacrum a.p.]
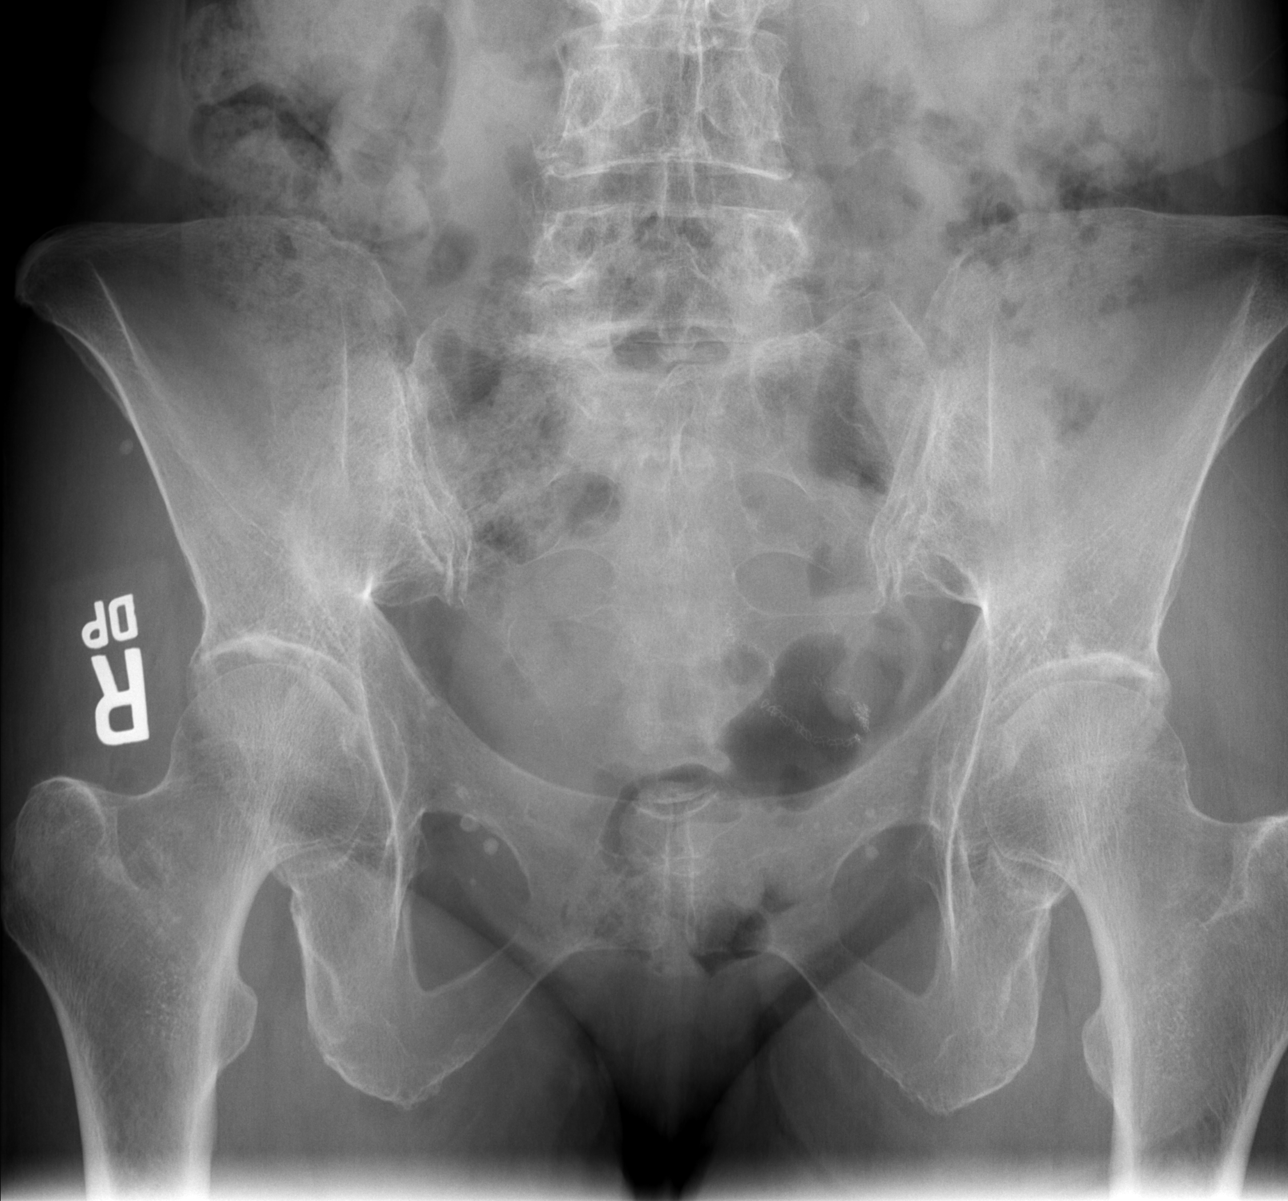

[t coccyx a.p.]
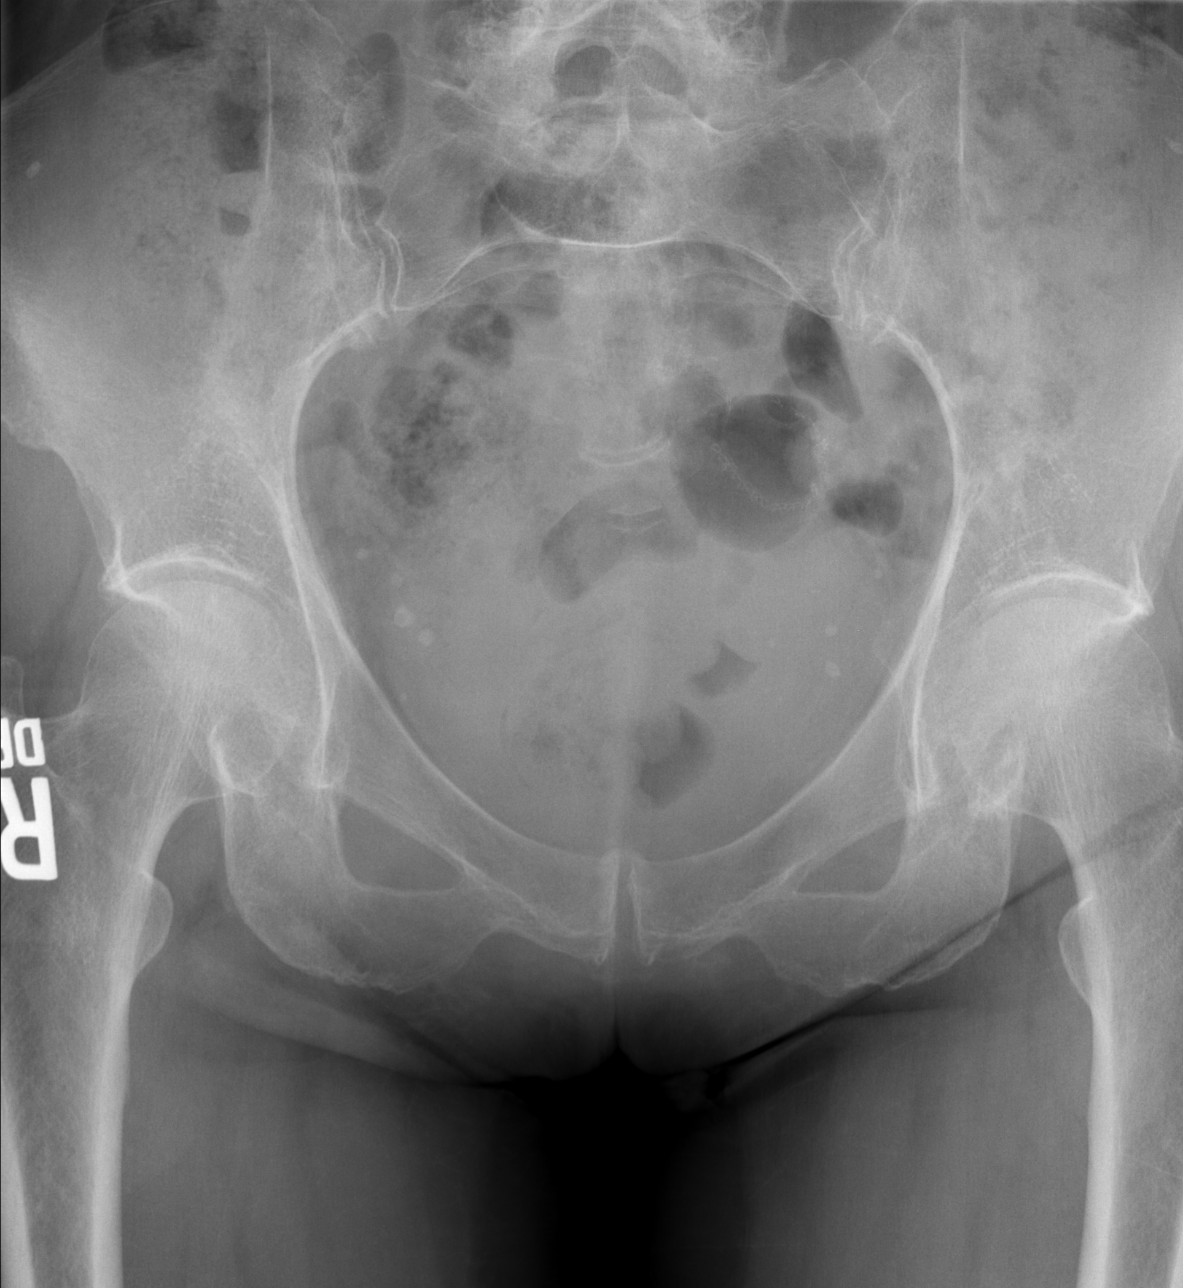

[t sacrum lat]
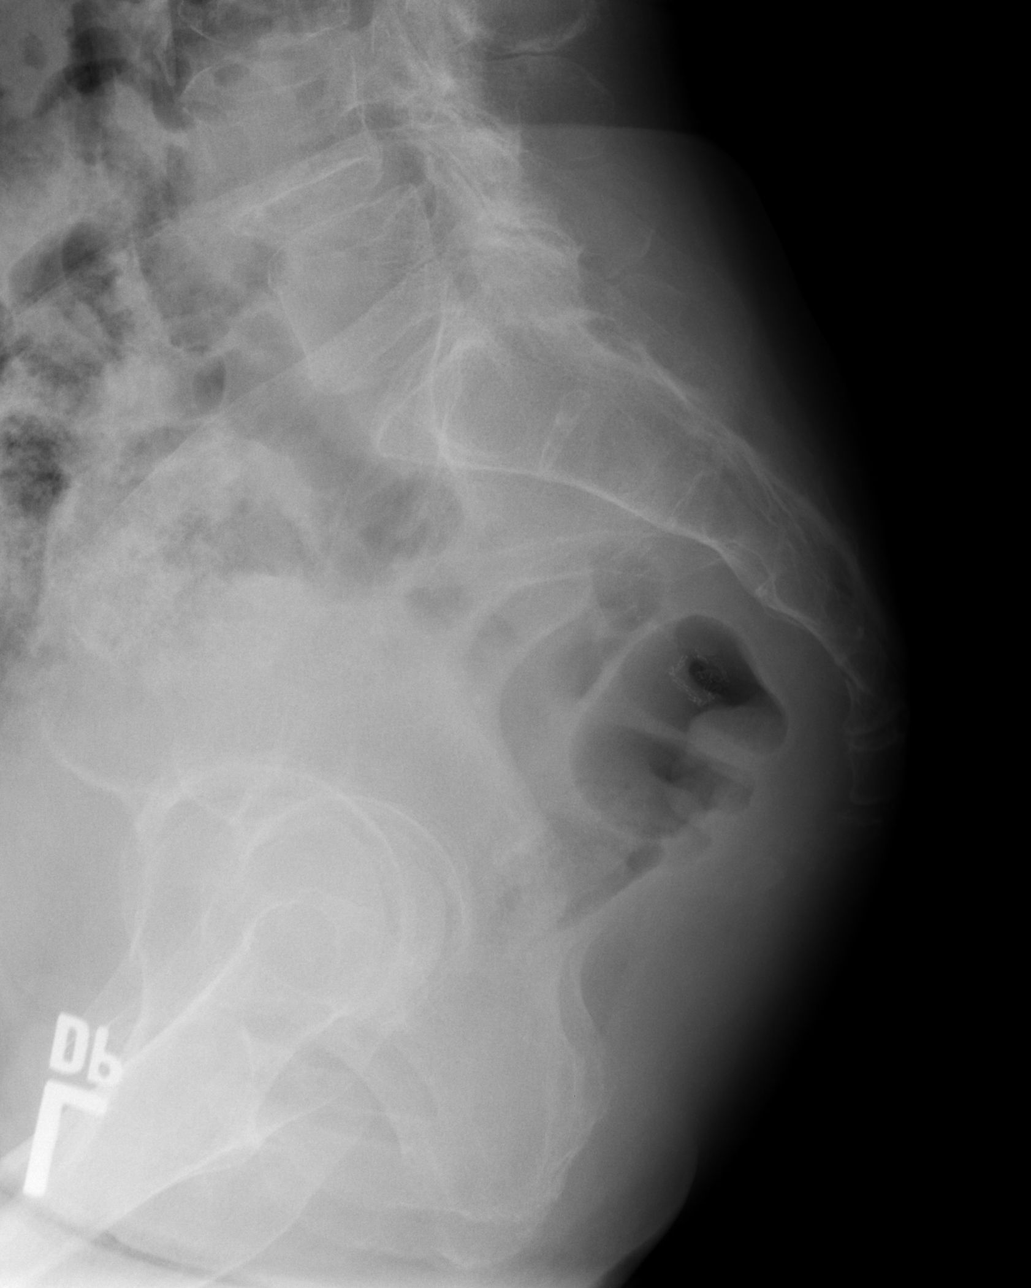

[3 of 3 positions shown; findings below may reference images not displayed]

FINDINGS: A mildly displaced fracture deformity is seen along the anterior
surface of the coccyx. Surgical sutures are seen overlying the lower
left hemipelvis. Multiple bilateral subcentimeter phleboliths are
seen.
IMPRESSION: Mildly displaced fracture of the anterior surface of the coccyx.

## 2019-10-22 DIAGNOSIS — M533 Sacrococcygeal disorders, not elsewhere classified: Secondary | ICD-10-CM | POA: Diagnosis not present

## 2020-05-06 DIAGNOSIS — E039 Hypothyroidism, unspecified: Secondary | ICD-10-CM | POA: Diagnosis not present

## 2020-05-06 DIAGNOSIS — R1011 Right upper quadrant pain: Secondary | ICD-10-CM | POA: Diagnosis not present

## 2020-12-31 DIAGNOSIS — R109 Unspecified abdominal pain: Secondary | ICD-10-CM | POA: Diagnosis not present

## 2020-12-31 DIAGNOSIS — R69 Illness, unspecified: Secondary | ICD-10-CM | POA: Diagnosis not present

## 2020-12-31 DIAGNOSIS — E039 Hypothyroidism, unspecified: Secondary | ICD-10-CM | POA: Diagnosis not present

## 2021-05-11 DIAGNOSIS — E039 Hypothyroidism, unspecified: Secondary | ICD-10-CM | POA: Diagnosis not present

## 2021-05-11 DIAGNOSIS — X500XXA Overexertion from strenuous movement or load, initial encounter: Secondary | ICD-10-CM | POA: Diagnosis not present

## 2021-05-11 DIAGNOSIS — Y9389 Activity, other specified: Secondary | ICD-10-CM | POA: Diagnosis not present

## 2021-05-11 DIAGNOSIS — S39012A Strain of muscle, fascia and tendon of lower back, initial encounter: Secondary | ICD-10-CM | POA: Diagnosis not present

## 2021-05-11 DIAGNOSIS — M545 Low back pain, unspecified: Secondary | ICD-10-CM | POA: Diagnosis not present

## 2021-05-11 DIAGNOSIS — Z28310 Unvaccinated for covid-19: Secondary | ICD-10-CM | POA: Diagnosis not present

## 2021-05-11 DIAGNOSIS — Y92009 Unspecified place in unspecified non-institutional (private) residence as the place of occurrence of the external cause: Secondary | ICD-10-CM | POA: Diagnosis not present

## 2021-06-17 DIAGNOSIS — E039 Hypothyroidism, unspecified: Secondary | ICD-10-CM | POA: Diagnosis not present

## 2021-06-23 DIAGNOSIS — J069 Acute upper respiratory infection, unspecified: Secondary | ICD-10-CM | POA: Diagnosis not present

## 2021-06-23 DIAGNOSIS — H6123 Impacted cerumen, bilateral: Secondary | ICD-10-CM | POA: Diagnosis not present

## 2021-06-23 DIAGNOSIS — Z20822 Contact with and (suspected) exposure to covid-19: Secondary | ICD-10-CM | POA: Diagnosis not present

## 2021-06-26 DIAGNOSIS — J019 Acute sinusitis, unspecified: Secondary | ICD-10-CM | POA: Diagnosis not present

## 2021-06-26 DIAGNOSIS — H6123 Impacted cerumen, bilateral: Secondary | ICD-10-CM | POA: Diagnosis not present

## 2021-08-26 DIAGNOSIS — M545 Low back pain, unspecified: Secondary | ICD-10-CM | POA: Diagnosis not present

## 2021-08-26 DIAGNOSIS — R69 Illness, unspecified: Secondary | ICD-10-CM | POA: Diagnosis not present

## 2021-08-26 DIAGNOSIS — S29012D Strain of muscle and tendon of back wall of thorax, subsequent encounter: Secondary | ICD-10-CM | POA: Diagnosis not present

## 2021-08-26 DIAGNOSIS — M533 Sacrococcygeal disorders, not elsewhere classified: Secondary | ICD-10-CM | POA: Diagnosis not present

## 2021-11-09 DIAGNOSIS — M4854XA Collapsed vertebra, not elsewhere classified, thoracic region, initial encounter for fracture: Secondary | ICD-10-CM | POA: Diagnosis not present

## 2021-11-09 DIAGNOSIS — M810 Age-related osteoporosis without current pathological fracture: Secondary | ICD-10-CM | POA: Diagnosis not present

## 2021-11-09 DIAGNOSIS — M545 Low back pain, unspecified: Secondary | ICD-10-CM | POA: Diagnosis not present

## 2021-11-18 DIAGNOSIS — M5451 Vertebrogenic low back pain: Secondary | ICD-10-CM | POA: Diagnosis not present

## 2021-11-18 DIAGNOSIS — M810 Age-related osteoporosis without current pathological fracture: Secondary | ICD-10-CM | POA: Diagnosis not present

## 2021-11-18 DIAGNOSIS — M4854XA Collapsed vertebra, not elsewhere classified, thoracic region, initial encounter for fracture: Secondary | ICD-10-CM | POA: Diagnosis not present

## 2021-11-20 DIAGNOSIS — M4854XA Collapsed vertebra, not elsewhere classified, thoracic region, initial encounter for fracture: Secondary | ICD-10-CM | POA: Diagnosis not present

## 2021-11-23 ENCOUNTER — Other Ambulatory Visit: Payer: Self-pay | Admitting: Orthopedic Surgery

## 2021-11-23 DIAGNOSIS — M810 Age-related osteoporosis without current pathological fracture: Secondary | ICD-10-CM

## 2021-11-25 DIAGNOSIS — M545 Low back pain, unspecified: Secondary | ICD-10-CM | POA: Diagnosis not present

## 2021-11-25 DIAGNOSIS — M546 Pain in thoracic spine: Secondary | ICD-10-CM | POA: Diagnosis not present

## 2021-11-26 DIAGNOSIS — M4854XA Collapsed vertebra, not elsewhere classified, thoracic region, initial encounter for fracture: Secondary | ICD-10-CM | POA: Diagnosis not present

## 2021-11-30 DIAGNOSIS — M4854XA Collapsed vertebra, not elsewhere classified, thoracic region, initial encounter for fracture: Secondary | ICD-10-CM | POA: Diagnosis not present

## 2021-11-30 DIAGNOSIS — M545 Low back pain, unspecified: Secondary | ICD-10-CM | POA: Diagnosis not present

## 2021-12-07 ENCOUNTER — Telehealth: Payer: Self-pay | Admitting: Hematology and Oncology

## 2021-12-07 NOTE — Telephone Encounter (Signed)
Scheduled appt per 3/2 referral. Pt is aware of appt date and time. Pt is aware to arrive 15 mins prior to appt time and to bring and updated insurance card. Pt is aware of appt location.   ?

## 2021-12-17 NOTE — Progress Notes (Signed)
[facility] ?CONSULT NOTE ? ?Patient Care Team: ?London Pepper, MD as PCP - General (Family Medicine) ? ?CHIEF COMPLAINTS/PURPOSE OF CONSULTATION:     ?Monoclonal gammopathy ? ?HISTORY OF PRESENTING ILLNESS:  ?Kiara Olson 66 y.o. female is here because of recent diagnosis of elevated monoclonal proteins. She presents to the clinic today for consult.  She has a prior history of macrocytic anemia and alpha-1 antitrypsin deficiencies.  She has had chronic back pain for which she has been to orthopedics.  The pain has been going on for at least 6 months with radiating down the buttocks on the right leg.  The pain is so excruciating that she is suffering a lot.  MRI of the lumbar spine showed multiple abnormalities including spinal stenosis as well as facet arthropathies along with some S1 nerve root displacement by disc protrusion.  This led to blood work that revealed elevated monoclonal protein and she was referred to [place] for evaluation for multiple myeloma. ? ?I reviewed her records extensively and collaborated the history with the patient. ? ? ?MEDICAL HISTORY:  ?Past Medical History:  ?Diagnosis Date  ? Alpha-1-antitrypsin deficiency ([place])   ? Moderate level  ? Aortic atherosclerosis ([place]) 2018  ? Diverticulitis 2018  ? GERD (gastroesophageal reflux disease)   ? Sleepers reflux  ? Hypothyroidism   ? Insomnia   ? Macrocytic anemia   ? Migraine   ? PONV (postoperative nausea and vomiting)   ? Seasonal allergies   ? ? ?SURGICAL HISTORY: ?Past Surgical History:  ?Procedure Laterality Date  ? ABDOMINAL HYSTERECTOMY  1980  ? ABDOMINOPLASTY  1989  ? BREAST ENHANCEMENT SURGERY    ? COLONOSCOPY    ? X3  ? CYSTOSCOPY W/ URETERAL STENT PLACEMENT Bilateral 12/08/2018  ? Procedure: CYSTOSCOPY WITH BILATERAL RETROGRADE PYELOGRAM/BILATERAL URETERAL STENT PLACEMENT;  Surgeon: Alexis Frock, MD;  Location: [facility];  Service: Urology;  Laterality: Bilateral;  ? FLEXIBLE SIGMOIDOSCOPY N/A 12/08/2018  ? Procedure:  FLEXIBLE SIGMOIDOSCOPY;  Surgeon: Ileana Roup, MD;  Location: [facility];  Service: General;  Laterality: N/A;  ? ? ?SOCIAL HISTORY: ?Social History  ? ?Socioeconomic History  ? Marital status: Widowed  ?  Spouse name: Not on file  ? Number of children: Not on file  ? Years of education: Not on file  ? Highest education level: Not on file  ?Occupational History  ? Not on file  ?Tobacco Use  ? Smoking status: Former  ?  Packs/day: 1.00  ?  Years: 10.00  ?  Pack years: 10.00  ?  Types: Cigarettes  ? Smokeless tobacco: Never  ?Vaping Use  ? Vaping Use: Never used  ?Substance and Sexual Activity  ? Alcohol use: Yes  ?  Comment: rare  ? Drug use: Never  ? Sexual activity: Not on file  ?Other Topics Concern  ? Not on file  ?Social History Narrative  ? Not on file  ? ?Social Determinants of Health  ? ?Financial Resource Strain: Not on file  ?Food Insecurity: Not on file  ?Transportation Needs: Not on file  ?Physical Activity: Not on file  ?Stress: Not on file  ?Social Connections: Not on file  ?Intimate Partner Violence: Not on file  ? ? ?FAMILY HISTORY: ?No family history on file. ? ?ALLERGIES:  is allergic to lidocaine. ? ?MEDICATIONS:  ?Current Outpatient Medications  ?Medication Sig Dispense Refill  ? ARMOUR THYROID 30 MG tablet Take 30 mg by mouth every morning.    ? Biotin 10000 MCG TABS Take  by mouth.    ? Cyanocobalamin (B-12) 2500 MCG SUBL Place 2,500 mcg under the tongue daily.    ? fluticasone (FLONASE) 50 MCG/ACT nasal spray Place 1 spray into both nostrils daily as needed for allergies or rhinitis.    ? LORazepam (ATIVAN) 1 MG tablet Take 1.5 mg by mouth at bedtime.    ? OVER THE COUNTER MEDICATION Apply 1 application topically daily. Magnesium oil    ? Potassium (POTASSIMIN PO) Place 10 drops under the tongue daily.    ? Vitamin D-Vitamin K (VITAMIN K2-VITAMIN D3 PO) Place 2.5 drops under the tongue daily.    ? ?No current facility-administered medications for this visit.  ? ? ?REVIEW OF SYSTEMS:    ?Constitutional: Denies fevers, chills or abnormal night sweats ?  ?All other systems were reviewed with the patient and are negative. ? ?PHYSICAL EXAMINATION: ?ECOG PERFORMANCE STATUS: 1 - Symptomatic but completely ambulatory ? ?Vitals:  ? 12/18/21 1303  ?BP: (!) 139/59  ?Pulse: 78  ?Resp: 18  ?Temp: 98.1 ?F (36.7 ?C)  ?SpO2: 98%  ? ?Filed Weights  ? 12/18/21 1303  ?Weight: 115 lb 3.2 oz (52.3 kg)  ? ?  ? ?LABORATORY DATA:  ?I have reviewed the data as listed ?Lab Results  ?Component Value Date  ? WBC 6.8 12/10/2018  ? HGB 9.8 (L) 12/10/2018  ? HCT 30.9 (L) 12/10/2018  ? MCV 109.2 (H) 12/10/2018  ? PLT 156 12/10/2018  ? ?Lab Results  ?Component Value Date  ? NA 140 12/11/2018  ? K 4.0 12/11/2018  ? CL 108 12/11/2018  ? CO2 27 12/11/2018  ? ?Is coming back okay ? ?ASSESSMENT AND PLAN:  ?MGUS (monoclonal gammopathy of unknown significance) ? ?Work-up initiated because of severe back pain that radiates down the leg ?Lab review ?11/20/2021: Urine protein electrophoresis: Abnormal protein band: 53 mg/24-hour, immunofixation: IgG kappa, ANA negative, hemoglobin 13.1, MCV 101, calcium 9.7, albumin 4.4, creatinine 0.9, SPEP: 1.3 g M spike IgG kappa ?MRI lumbar spine: 11/19/2021: L2 chronic mild anterior superior endplate fracture, A7-G8 biforaminal stenosis, L2-L3 spinal canal stenosis with disc protrusion, L3-L4 mild to moderate foraminal stenosis, L4-L5 severe facet arthropathy, L5-S1 spinal canal stenosis, facet arthropathy ? ?Counseling: I discussed with the patient the spectrum of disorders from MGUS to multiple myeloma. We discussed the role of plasma cells in producing immunoglobulins. We discussed structure of immunoglobulins on how they make up the heavy chains and the light chains. The light chains are Kappa and lambda. I discussed the difference between MGUS and multiple myeloma. MGUS is characterized by elevation monoclonal protein without any end organ damage. Multiple myeloma is associated with elevation  monoclonal protein and end organ damage (hypercalcemia, renal dysfunction, anemia, bone lytic lesions ) along with a bone marrow showing greater than 10% plasma cells. ? ?Workup recommended: ?1. Serum free light chain assay and beta-2 microglobulin ?2. Bone marrow biopsy  ?3.  CT chest abdomen pelvis in order to make sure there is no soft tissue abnormality compressing on the spine. ?4.  Mammograms also will need to be done. ? ?We will perform bone marrow biopsy on Monday and try to obtain CT scans and Tuesday ?Follow-up in 1 week after that to discuss results ? ? ?All questions were answered. The patient knows to call the clinic with any problems, questions or concerns. ?  ? Harriette Ohara, MD ?12/18/21 ?  ?I Gardiner Coins am scribing for Dr. Lindi Adie ? ?I have reviewed the above documentation for accuracy and completeness, and  I agree with the above. ?  ?

## 2021-12-18 ENCOUNTER — Inpatient Hospital Stay: Payer: Medicare HMO | Attending: Hematology and Oncology

## 2021-12-18 ENCOUNTER — Other Ambulatory Visit: Payer: Self-pay

## 2021-12-18 ENCOUNTER — Inpatient Hospital Stay: Payer: Medicare HMO

## 2021-12-18 ENCOUNTER — Other Ambulatory Visit: Payer: Self-pay | Admitting: *Deleted

## 2021-12-18 ENCOUNTER — Inpatient Hospital Stay: Payer: Medicare HMO | Admitting: Hematology and Oncology

## 2021-12-18 VITALS — BP 139/59 | HR 78 | Temp 98.1°F | Resp 18 | Ht 62.0 in | Wt 115.2 lb

## 2021-12-18 DIAGNOSIS — D472 Monoclonal gammopathy: Secondary | ICD-10-CM | POA: Insufficient documentation

## 2021-12-18 DIAGNOSIS — E8801 Alpha-1-antitrypsin deficiency: Secondary | ICD-10-CM | POA: Insufficient documentation

## 2021-12-18 DIAGNOSIS — K219 Gastro-esophageal reflux disease without esophagitis: Secondary | ICD-10-CM | POA: Insufficient documentation

## 2021-12-18 DIAGNOSIS — M546 Pain in thoracic spine: Secondary | ICD-10-CM | POA: Insufficient documentation

## 2021-12-18 DIAGNOSIS — I700 Atherosclerosis of aorta: Secondary | ICD-10-CM | POA: Insufficient documentation

## 2021-12-18 DIAGNOSIS — G8929 Other chronic pain: Secondary | ICD-10-CM | POA: Insufficient documentation

## 2021-12-18 DIAGNOSIS — E039 Hypothyroidism, unspecified: Secondary | ICD-10-CM | POA: Insufficient documentation

## 2021-12-18 DIAGNOSIS — Z79899 Other long term (current) drug therapy: Secondary | ICD-10-CM | POA: Insufficient documentation

## 2021-12-18 DIAGNOSIS — Z87891 Personal history of nicotine dependence: Secondary | ICD-10-CM | POA: Insufficient documentation

## 2021-12-18 LAB — CBC WITH DIFFERENTIAL (CANCER CENTER ONLY)
Abs Immature Granulocytes: 0 10*3/uL (ref 0.00–0.07)
Basophils Absolute: 0 10*3/uL (ref 0.0–0.1)
Basophils Relative: 0 %
Eosinophils Absolute: 0 10*3/uL (ref 0.0–0.5)
Eosinophils Relative: 1 %
HCT: 37.1 % (ref 36.0–46.0)
Hemoglobin: 12.7 g/dL (ref 12.0–15.0)
Immature Granulocytes: 0 %
Lymphocytes Relative: 46 %
Lymphs Abs: 2.1 10*3/uL (ref 0.7–4.0)
MCH: 34.6 pg — ABNORMAL HIGH (ref 26.0–34.0)
MCHC: 34.2 g/dL (ref 30.0–36.0)
MCV: 101.1 fL — ABNORMAL HIGH (ref 80.0–100.0)
Monocytes Absolute: 0.3 10*3/uL (ref 0.1–1.0)
Monocytes Relative: 6 %
Neutro Abs: 2.1 10*3/uL (ref 1.7–7.7)
Neutrophils Relative %: 47 %
Platelet Count: 218 10*3/uL (ref 150–400)
RBC: 3.67 MIL/uL — ABNORMAL LOW (ref 3.87–5.11)
RDW: 12.1 % (ref 11.5–15.5)
WBC Count: 4.6 10*3/uL (ref 4.0–10.5)
nRBC: 0 % (ref 0.0–0.2)

## 2021-12-18 NOTE — Assessment & Plan Note (Signed)
Lab review ?11/20/2021: Urine protein electrophoresis: Abnormal protein band: 53 mg/24-hour, immunofixation: IgG kappa, ANA negative, hemoglobin 13.1, MCV 101, calcium 9.7, albumin 4.4, creatinine 0.9, SPEP: 1.3 g M spike IgG kappa ? ?Counseling: I discussed with the patient the spectrum of disorders from MGUS to multiple myeloma. We discussed the role of plasma cells in producing immunoglobulins. We discussed structure of immunoglobulins on how they make up the heavy chains and the light chains. The light chains are Kappa and lambda. I discussed the difference between MGUS and multiple myeloma. MGUS is characterized by elevation monoclonal protein without any end organ damage. Multiple myeloma is associated with elevation monoclonal protein and end organ damage (hypercalcemia, renal dysfunction, anemia, bone lytic lesions ) along with a bone marrow showing greater than 10% plasma cells. ? ?Workup recommended: ?1. Serum free light chain assay ?2. Bone survey ?3. Bone marrow biopsy would be indicated if there were endorgan dysfunction.  We will wait for the results of the bone survey before making a decision on this visit ? ?Telephone visit in 1 week to discuss results ? ?

## 2021-12-19 LAB — BETA 2 MICROGLOBULIN, SERUM: Beta-2 Microglobulin: 2 mg/L (ref 0.6–2.4)

## 2021-12-21 ENCOUNTER — Inpatient Hospital Stay (HOSPITAL_BASED_OUTPATIENT_CLINIC_OR_DEPARTMENT_OTHER): Payer: Medicare HMO | Admitting: Hematology and Oncology

## 2021-12-21 ENCOUNTER — Inpatient Hospital Stay: Payer: Medicare HMO

## 2021-12-21 ENCOUNTER — Other Ambulatory Visit: Payer: Self-pay

## 2021-12-21 VITALS — BP 124/70 | HR 66 | Temp 97.9°F | Resp 17

## 2021-12-21 DIAGNOSIS — D472 Monoclonal gammopathy: Secondary | ICD-10-CM

## 2021-12-21 DIAGNOSIS — E8801 Alpha-1-antitrypsin deficiency: Secondary | ICD-10-CM | POA: Diagnosis not present

## 2021-12-21 DIAGNOSIS — K219 Gastro-esophageal reflux disease without esophagitis: Secondary | ICD-10-CM | POA: Diagnosis not present

## 2021-12-21 DIAGNOSIS — G8929 Other chronic pain: Secondary | ICD-10-CM | POA: Diagnosis not present

## 2021-12-21 DIAGNOSIS — Z87891 Personal history of nicotine dependence: Secondary | ICD-10-CM | POA: Diagnosis not present

## 2021-12-21 DIAGNOSIS — E039 Hypothyroidism, unspecified: Secondary | ICD-10-CM | POA: Diagnosis not present

## 2021-12-21 DIAGNOSIS — I700 Atherosclerosis of aorta: Secondary | ICD-10-CM | POA: Diagnosis not present

## 2021-12-21 DIAGNOSIS — M546 Pain in thoracic spine: Secondary | ICD-10-CM | POA: Diagnosis not present

## 2021-12-21 DIAGNOSIS — Z79899 Other long term (current) drug therapy: Secondary | ICD-10-CM | POA: Diagnosis not present

## 2021-12-21 LAB — CBC WITH DIFFERENTIAL (CANCER CENTER ONLY)
Abs Immature Granulocytes: 0 10*3/uL (ref 0.00–0.07)
Basophils Absolute: 0 10*3/uL (ref 0.0–0.1)
Basophils Relative: 1 %
Eosinophils Absolute: 0 10*3/uL (ref 0.0–0.5)
Eosinophils Relative: 1 %
HCT: 34.9 % — ABNORMAL LOW (ref 36.0–46.0)
Hemoglobin: 12.2 g/dL (ref 12.0–15.0)
Immature Granulocytes: 0 %
Lymphocytes Relative: 37 %
Lymphs Abs: 1.4 10*3/uL (ref 0.7–4.0)
MCH: 34.5 pg — ABNORMAL HIGH (ref 26.0–34.0)
MCHC: 35 g/dL (ref 30.0–36.0)
MCV: 98.6 fL (ref 80.0–100.0)
Monocytes Absolute: 0.3 10*3/uL (ref 0.1–1.0)
Monocytes Relative: 8 %
Neutro Abs: 2 10*3/uL (ref 1.7–7.7)
Neutrophils Relative %: 53 %
Platelet Count: 198 10*3/uL (ref 150–400)
RBC: 3.54 MIL/uL — ABNORMAL LOW (ref 3.87–5.11)
RDW: 11.9 % (ref 11.5–15.5)
WBC Count: 3.7 10*3/uL — ABNORMAL LOW (ref 4.0–10.5)
nRBC: 0 % (ref 0.0–0.2)

## 2021-12-21 LAB — KAPPA/LAMBDA LIGHT CHAINS
Kappa free light chain: 367.7 mg/L — ABNORMAL HIGH (ref 3.3–19.4)
Kappa, lambda light chain ratio: 70.71 — ABNORMAL HIGH (ref 0.26–1.65)
Lambda free light chains: 5.2 mg/L — ABNORMAL LOW (ref 5.7–26.3)

## 2021-12-21 MED ORDER — ACETAMINOPHEN 325 MG PO TABS
650.0000 mg | ORAL_TABLET | Freq: Once | ORAL | Status: AC
  Administered 2021-12-21: 650 mg via ORAL
  Filled 2021-12-21: qty 2

## 2021-12-21 MED ORDER — DIPHENHYDRAMINE HCL 25 MG PO CAPS
25.0000 mg | ORAL_CAPSULE | Freq: Once | ORAL | Status: AC
  Administered 2021-12-21: 25 mg via ORAL
  Filled 2021-12-21: qty 1

## 2021-12-21 NOTE — Progress Notes (Signed)
INDICATION: Monoclonal gammopathy ? ?Bone Marrow Biopsy and Aspiration Procedure Note  ? ?Informed consent was obtained and potential risks including bleeding, infection and pain were reviewed with the patient. ? ?The patient's name, date of birth, identification, consent and allergies were verified prior to the start of procedure and time out was performed. ? ?The right posterior iliac crest was chosen as the site of biopsy. ? ?The skin was prepped with ChloraPrep.  ? ?8 cc of 1% lidocaine was used to provide local anaesthesia.  ? ?10 cc of bone marrow aspirate was obtained followed by 1cm of soft biopsy.  ?Pressure was applied to the biopsy site and bandage was placed over the biopsy site. ?Patient was made to lie on the back for 30 mins prior to discharge. ? ?The procedure was tolerated well. ?COMPLICATIONS: None ?BLOOD LOSS: none ?The patient was discharged home in stable condition with a 1 week follow up to review results.  Patient was provided with post bone marrow biopsy instructions and instructed to call if there was any bleeding or worsening pain. ? ?Specimens sent for flow cytometry, cytogenetics and additional studies. ? ?Signed ?Viinay K Gudena, MD ? ?

## 2021-12-21 NOTE — Patient Instructions (Signed)
Bone Marrow Aspiration and Bone Marrow Biopsy, Adult, Care After This sheet gives you information about how to care for yourself after your procedure. Your health care provider may also give you more specific instructions. If you have problems or questions, contact your health careprovider. What can I expect after the procedure? After the procedure, it is common to have: Mild pain and tenderness. Swelling. Bruising. Follow these instructions at home: Puncture site care  Follow instructions from your health care provider about how to take care of the puncture site. Make sure you: Wash your hands with soap and water before and after you change your bandage (dressing). If soap and water are not available, use hand sanitizer. Change your dressing as told by your health care provider. Check your puncture site every day for signs of infection. Check for: More redness, swelling, or pain. Fluid or blood. Warmth. Pus or a bad smell.  Activity Return to your normal activities as told by your health care provider. Ask your health care provider what activities are safe for you. Do not lift anything that is heavier than 10 lb (4.5 kg), or the limit that you are told, until your health care provider says that it is safe. Do not drive for 24 hours if you were given a sedative during your procedure. General instructions  Take over-the-counter and prescription medicines only as told by your health care provider. Do not take baths, swim, or use a hot tub until your health care provider approves. Ask your health care provider if you may take showers. You may only be allowed to take sponge baths. If directed, put ice on the affected area. To do this: Put ice in a plastic bag. Place a towel between your skin and the bag. Leave the ice on for 20 minutes, 2-3 times a day. Keep all follow-up visits as told by your health care provider. This is important.  Contact a health care provider if: Your pain is not  controlled with medicine. You have a fever. You have more redness, swelling, or pain around the puncture site. You have fluid or blood coming from the puncture site. Your puncture site feels warm to the touch. You have pus or a bad smell coming from the puncture site. Summary After the procedure, it is common to have mild pain, tenderness, swelling, and bruising. Follow instructions from your health care provider about how to take care of the puncture site and what activities are safe for you. Take over-the-counter and prescription medicines only as told by your health care provider. Contact a health care provider if you have any signs of infection, such as fluid or blood coming from the puncture site. This information is not intended to replace advice given to you by your health care provider. Make sure you discuss any questions you have with your healthcare provider. Document Revised: 01/30/2019 Document Reviewed: 01/30/2019 Elsevier Patient Education  2022 [employer]  

## 2021-12-21 NOTE — Progress Notes (Signed)
Dressing clean dry and intact.  Patient discharged in stable condition with daughter ?

## 2021-12-21 NOTE — Progress Notes (Signed)
Per Dr. Lindi Adie, patient to be pre-medicated with Tylenol and Benadryl for Lidocaine allergy.  Order received. ?

## 2021-12-22 ENCOUNTER — Ambulatory Visit (HOSPITAL_BASED_OUTPATIENT_CLINIC_OR_DEPARTMENT_OTHER)
Admission: RE | Admit: 2021-12-22 | Discharge: 2021-12-22 | Disposition: A | Discharge status: Home / Self Care | Payer: Medicare HMO | Source: Ambulatory Visit | Attending: Hematology and Oncology | Admitting: Hematology and Oncology

## 2021-12-22 DIAGNOSIS — M546 Pain in thoracic spine: Secondary | ICD-10-CM | POA: Insufficient documentation

## 2021-12-22 DIAGNOSIS — M545 Low back pain, unspecified: Secondary | ICD-10-CM | POA: Diagnosis not present

## 2021-12-22 DIAGNOSIS — R0781 Pleurodynia: Secondary | ICD-10-CM | POA: Diagnosis not present

## 2021-12-22 DIAGNOSIS — I700 Atherosclerosis of aorta: Secondary | ICD-10-CM | POA: Diagnosis not present

## 2021-12-22 DIAGNOSIS — I719 Aortic aneurysm of unspecified site, without rupture: Secondary | ICD-10-CM | POA: Insufficient documentation

## 2021-12-22 DIAGNOSIS — Z9049 Acquired absence of other specified parts of digestive tract: Secondary | ICD-10-CM | POA: Insufficient documentation

## 2021-12-22 LAB — POCT I-STAT CREATININE: Creatinine, Ser: 1 mg/dL (ref 0.44–1.00)

## 2021-12-22 IMAGING — CT CT CHEST-ABD-PELV W/ CM
2 of 5 series · 13 of 46 positions shown, 15 images · IV contrast (agent unspecified)
Comparison: Abdominopelvic CT [DATE].

CLINICAL DATA: Low back pain radiating into the ribs intermittently
for 1 year, now worse. Patient underwent recent bone marrow biopsy
to diagnose multiple myeloma. Clinical concern for aneurysm.

EXAM:
CT CHEST, ABDOMEN, AND PELVIS WITH CONTRAST
TECHNIQUE: Multidetector CT imaging of the chest, abdomen and pelvis was
performed following the standard protocol during bolus
administration of intravenous contrast.

[Series 2: cap with · axial · 0.64mm/px · z∈[+710,+1200]mm · 10 of 120 slices shown, 12 images]
[im 11/120  soft-tissue]
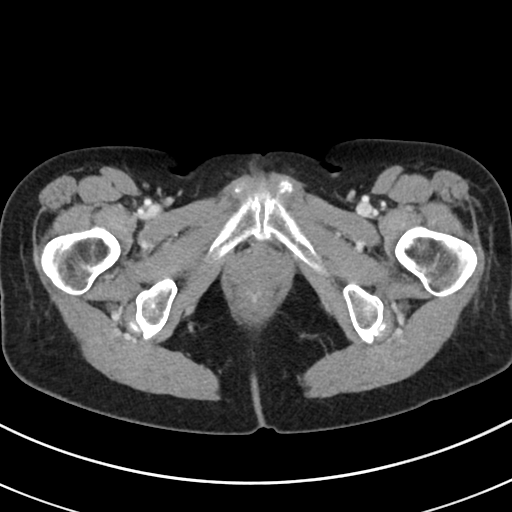
[im 11/120  bone]
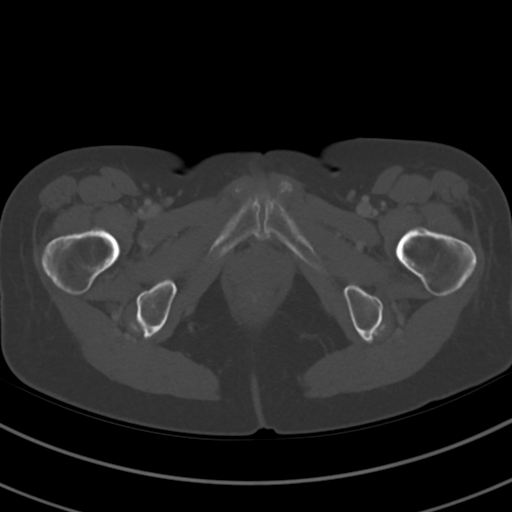
[im 22/120  soft-tissue]
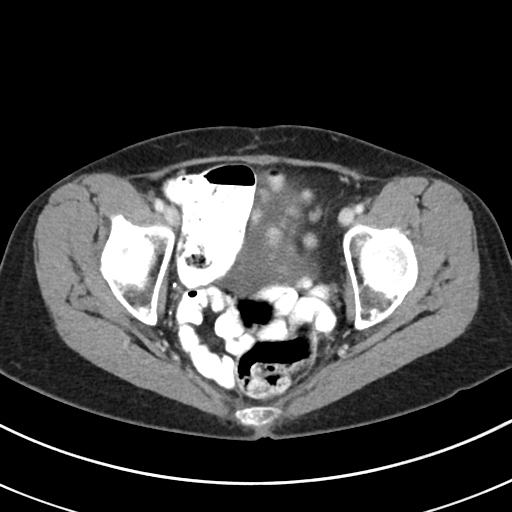
[im 33/120  soft-tissue]
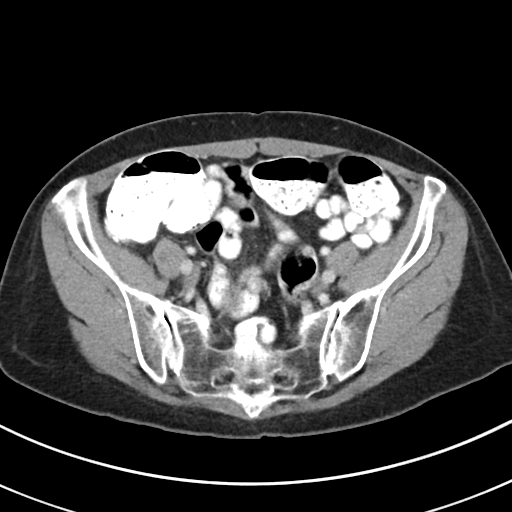
[im 44/120  soft-tissue]
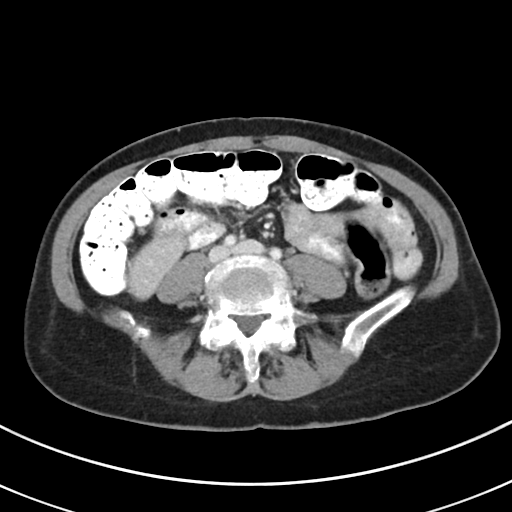
[im 55/120  soft-tissue]
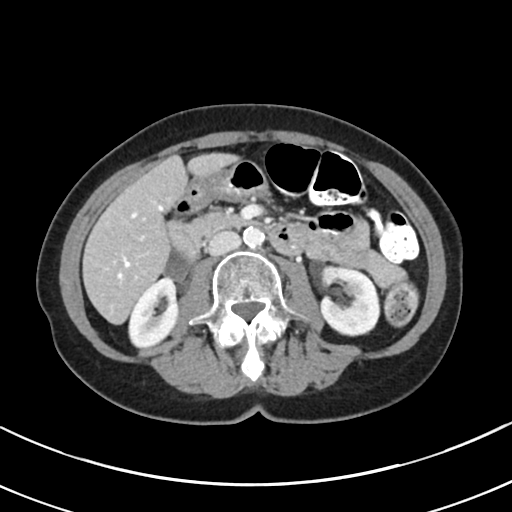
[im 65/120  soft-tissue]
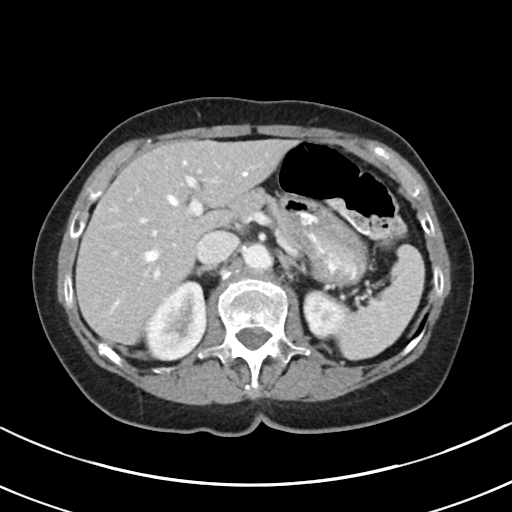
[im 76/120  soft-tissue]
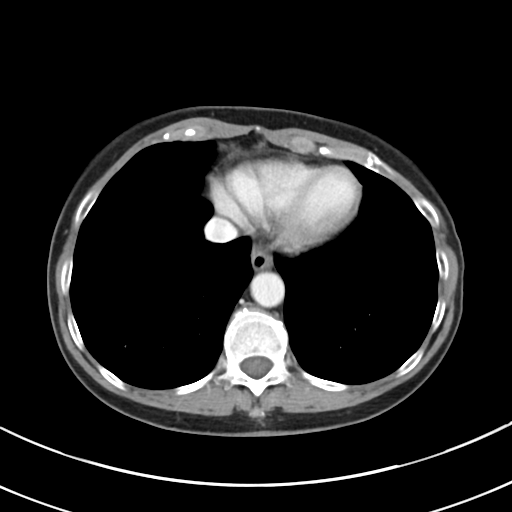
[im 87/120  soft-tissue]
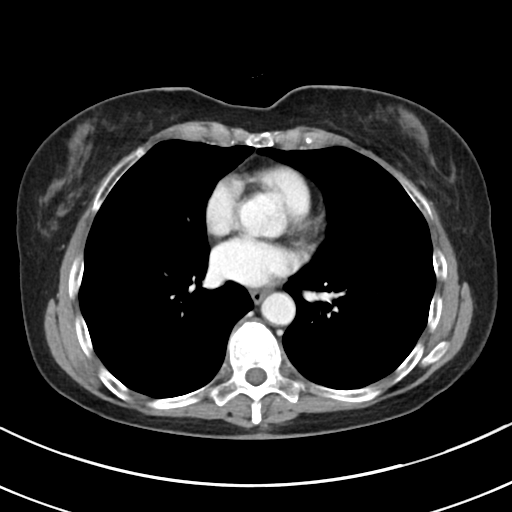
[im 98/120  soft-tissue]
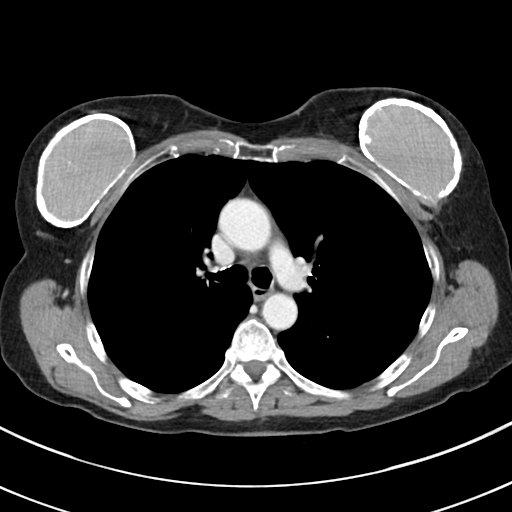
[im 98/120  bone]
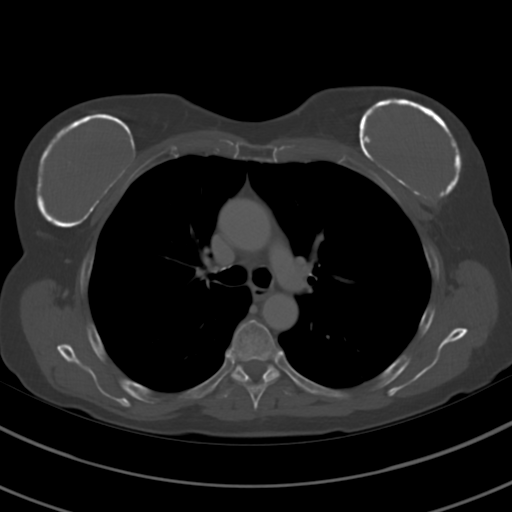
[im 109/120  soft-tissue]
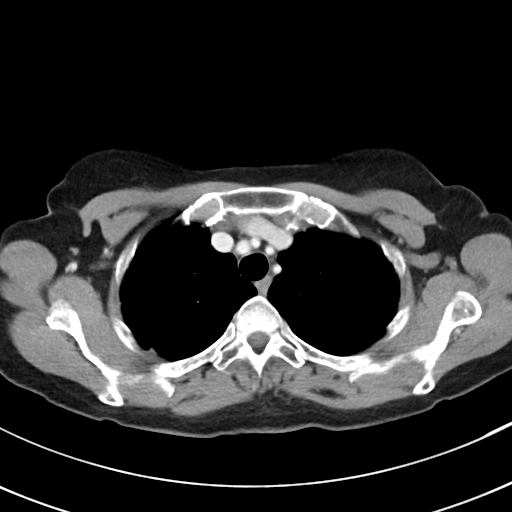

[Series 5: coronal · coronal · 0.70mm/px · 3 of 79 slices shown]
[im 27/79  soft-tissue]
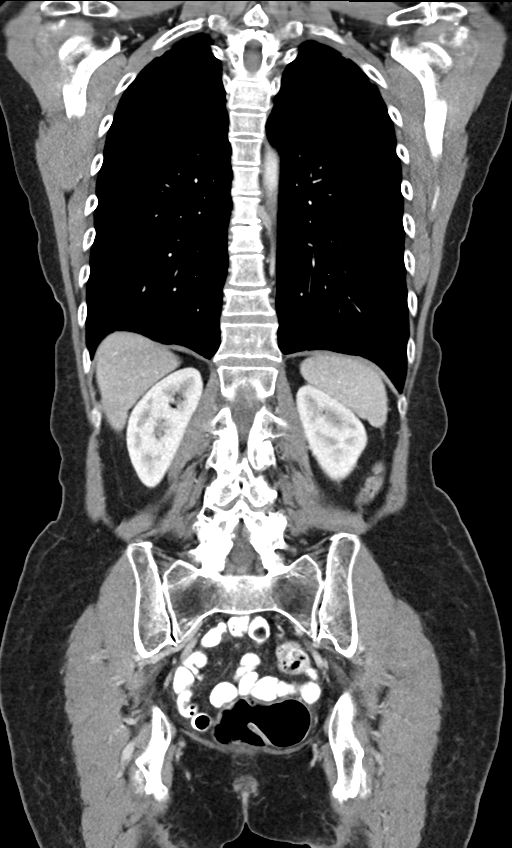
[im 35/79  soft-tissue]
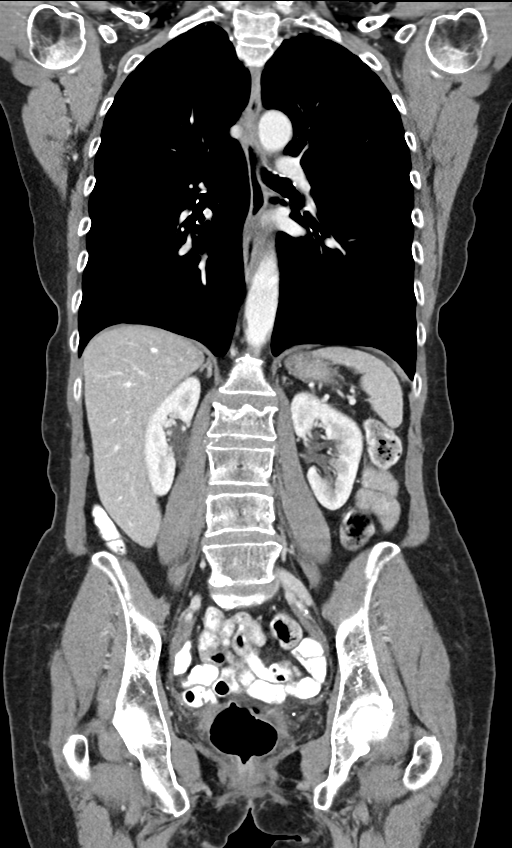
[im 44/79  soft-tissue]
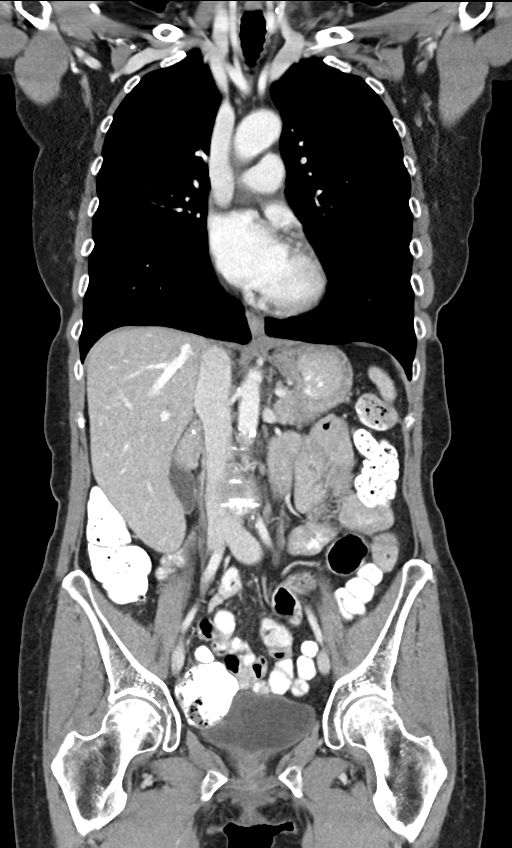

[13 of 46 positions shown; findings below may reference images not displayed]

RADIATION DOSE REDUCTION: This exam was performed according to the
departmental dose-optimization program which includes automated
exposure control, adjustment of the mA and/or kV according to
patient size and/or use of iterative reconstruction technique.

CONTRAST:  60mL OMNIPAQUE IOHEXOL 350 MG/ML SOLN
FINDINGS: CT CHEST FINDINGS

Cardiovascular: No acute vascular findings. There is no evidence of
aortic aneurysm or dissection. No evidence of acute pulmonary
embolism. Minimal aortic atherosclerosis. The heart size is normal.
There is no pericardial effusion.

Mediastinum/Nodes: There are no enlarged mediastinal, hilar or
axillary lymph nodes. The thyroid gland, trachea and esophagus
demonstrate no significant findings.

Lungs/Pleura: No pleural effusion or pneumothorax. Scattered mild
subpleural scarring in both lungs. No suspicious pulmonary nodules
or confluent airspace opacities.

Musculoskeletal/Chest wall: Calcified bilateral breast implants. No
chest wall mass or suspicious osseous lesions. There are mild
age-indeterminate superior endplate compression deformities at T9
and T11, not seen on lumbar radiographs [DATE].

CT ABDOMEN AND PELVIS FINDINGS

Hepatobiliary: The liver is normal in density without suspicious
focal abnormality. No evidence of gallstones, gallbladder wall
thickening or biliary dilatation.

Pancreas: Unremarkable. No pancreatic ductal dilatation or
surrounding inflammatory changes.

Spleen: Normal in size without focal abnormality.

Adrenals/Urinary Tract: Both adrenal glands appear normal. The
kidneys appear normal without evidence of urinary tract calculus,
suspicious lesion or hydronephrosis. No bladder abnormalities are
seen.

Stomach/Bowel: Enteric contrast was administered and has passed into
the distal colon. The stomach appears unremarkable for its degree of
distension. No evidence of bowel wall thickening, distention or
surrounding inflammatory change. Evidence of interval partial
sigmoidectomy with anastomosis.

Vascular/Lymphatic: There are no enlarged abdominal or pelvic lymph
nodes. Aortic and branch vessel atherosclerosis without evidence of
aneurysm, large vessel occlusion or dissection.

Reproductive: Hysterectomy.  No adnexal mass.

Other: No evidence of abdominal wall mass or hernia. No ascites.

Musculoskeletal: Interval development of a mild superior endplate
compression deformities/Schmorl's node at L2 with sclerotic margins,
not likely to be acute. No definite acute lumbar spine findings or
suspicious lytic lesions. Mild degenerative anterolisthesis at L4-5.
No evidence of pelvic hematoma related to reported recent bone
marrow biopsy.
IMPRESSION: 1. No acute vascular findings in the chest, abdomen or pelvis.
Specifically, no evidence of aortic aneurysm or dissection.
2. Age-indeterminate compression deformities at T9, T11 and L2, not
seen on lumbar radiographs [DATE].
[DATE]. Interval partial sigmoidectomy with anastomosis. No acute
inflammatory changes.
4. Aortic Atherosclerosis ([55]-[55]).

## 2021-12-22 MED ORDER — IOHEXOL 350 MG/ML SOLN
100.0000 mL | Freq: Once | INTRAVENOUS | Status: AC | PRN
  Administered 2021-12-22: 60 mL via INTRAVENOUS

## 2021-12-23 LAB — MULTIPLE MYELOMA PANEL, SERUM
Albumin SerPl Elph-Mcnc: 4.2 g/dL (ref 2.9–4.4)
Albumin/Glob SerPl: 1.6 (ref 0.7–1.7)
Alpha 1: 0.1 g/dL (ref 0.0–0.4)
Alpha2 Glob SerPl Elph-Mcnc: 0.8 g/dL (ref 0.4–1.0)
B-Globulin SerPl Elph-Mcnc: 1.7 g/dL — ABNORMAL HIGH (ref 0.7–1.3)
Gamma Glob SerPl Elph-Mcnc: 0.2 g/dL — ABNORMAL LOW (ref 0.4–1.8)
Globulin, Total: 2.8 g/dL (ref 2.2–3.9)
IgA: 38 mg/dL — ABNORMAL LOW (ref 87–352)
IgG (Immunoglobin G), Serum: 1075 mg/dL (ref 586–1602)
IgM (Immunoglobulin M), Srm: 33 mg/dL (ref 26–217)
M Protein SerPl Elph-Mcnc: 1 g/dL — ABNORMAL HIGH
Total Protein ELP: 7 g/dL (ref 6.0–8.5)

## 2021-12-24 ENCOUNTER — Other Ambulatory Visit (HOSPITAL_COMMUNITY): Payer: Medicare HMO

## 2021-12-25 NOTE — Progress Notes (Signed)
? ?Patient Care Team: ?London Pepper, MD as PCP - General (Family Medicine) ? ?DIAGNOSIS:  ?Encounter Diagnoses  ?Name Primary?  ? MGUS (monoclonal gammopathy of unknown significance)   ? 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 ?CHIEF COMPLIANT: Monoclonal gammopathy ?  ? ?INTERVAL HISTORY: Kiara Olson is a 66 y.o. female is here because of recent diagnosis of elevated monoclonal proteins. She presents to the clinic today for a follow-up ?She complains of pain and it takes a while to get dress. Also when using her upper body she is in pain. ?She tolerated bone marrow biopsy extremely well without any problems or concerns. ? ?ALLERGIES:  is allergic to lidocaine. ? ?MEDICATIONS:  ?Current Outpatient Medications  ?Medication Sig Dispense Refill  ? oxyCODONE-acetaminophen (PERCOCET/ROXICET) 5-325 MG tablet Take 1 tablet by mouth every 6 (six) hours as needed for severe pain. 60 tablet 0  ? ARMOUR THYROID 30 MG tablet Take 30 mg by mouth every morning.    ? Biotin 10000 MCG TABS Take by mouth.    ? Cyanocobalamin (B-12) 2500 MCG SUBL Place 2,500 mcg under the tongue daily.    ? fluticasone (FLONASE) 50 MCG/ACT nasal spray Place 1 spray into both nostrils daily as needed for allergies or rhinitis.    ? LORazepam (ATIVAN) 1 MG tablet Take 1.5 mg by mouth at bedtime.    ? OVER THE COUNTER MEDICATION Apply 1 application topically daily. Magnesium oil    ? Potassium (POTASSIMIN PO) Place 10 drops under the tongue daily.    ? Vitamin D-Vitamin K (VITAMIN K2-VITAMIN D3 PO) Place 2.5 drops under the tongue daily.    ? ?No current facility-administered medications for this visit.  ? ? ?PHYSICAL EXAMINATION: ?ECOG PERFORMANCE STATUS: 1 - Symptomatic but completely ambulatory ? ?Vitals:  ? 12/28/21 1339  ?BP: (!) 131/57  ?Pulse: 84  ?Resp: 18  ?Temp: (!) 97.3 ?F (36.3 ?C)  ?SpO2: 100%  ? ?Filed Weights  ? 12/28/21 1339  ?Weight: 115 lb 9.6 oz (52.4 kg)  ? ?  ? ?LABORATORY DATA:  ?I have reviewed the data  as listed ? ?  Latest Ref Rng & Units 12/22/2021  ?  2:18 PM 12/11/2018  ?  4:02 AM 12/10/2018  ?  4:05 AM  ?CMP  ?Glucose 70 - 99 mg/dL  102   99    ?BUN 8 - 23 mg/dL  9   13    ?Creatinine 0.44 - 1.00 mg/dL 1.00   0.76   0.94    ?Sodium 135 - 145 mmol/L  140   139    ?Potassium 3.5 - 5.1 mmol/L  4.0   4.7    ?Chloride 98 - 111 mmol/L  108   107    ?CO2 22 - 32 mmol/L  27   28    ?Calcium 8.9 - 10.3 mg/dL  8.3   8.4    ? ? ?Lab Results  ?Component Value Date  ? WBC 3.7 (L) 12/21/2021  ? HGB 12.2 12/21/2021  ? HCT 34.9 (L) 12/21/2021  ? MCV 98.6 12/21/2021  ? PLT 198 12/21/2021  ? NEUTROABS 2.0 12/21/2021  ? ? ?ASSESSMENT & PLAN:  ?MGUS (monoclonal gammopathy of unknown significance) ?Lab review ?11/20/2021: Urine protein electrophoresis: Abnormal protein band: 53 mg/24-hour, immunofixation: IgG kappa, ANA negative, hemoglobin 13.1, MCV 101, calcium 9.7, albumin 4.4, creatinine 0.9, SPEP: 1.3 g M spike IgG kappa ?MRI lumbar spine: 11/19/2021: L2 chronic mild anterior superior endplate fracture, P5-K9 biforaminal stenosis, L2-L3  spinal canal stenosis with disc protrusion, L3-L4 mild to moderate foraminal stenosis, L4-L5 severe facet arthropathy, L5-S1 spinal canal stenosis, facet arthropathy ? ?Bone marrow biopsy 12/21/2021: Mildly hypercellular bone marrow 40 to 50% cellularity with 16% plasma cells kappa light chain restricted (cytogenetics and FISH panel are pending) ? ?Counseling: I discussed with the patient of the bone marrow showing greater than 10% plasma cells is suggesting that the patient may have multiple myeloma.  It is still challenging to figure out if the bone findings on the MRI are related to myeloma or not. ? ?I would like the patient to be seen by her malignant hematology team to discuss the next steps in the approach to treatment of her monoclonal gammopathy.  I called and discussed the case with Dr.Kale who is willing to see her in a couple of weeks.  We will obtain a PET CT scan prior to his  visit. ? ? ? ?Orders Placed This Encounter  ?Procedures  ? NM PET Image Restage (PS) Whole Body  ?  Standing Status:   Future  ?  Standing Expiration Date:   12/28/2022  ?  Order Specific Question:   If indicated for the ordered procedure, I authorize the administration of a radiopharmaceutical per Radiology protocol  ?  Answer:   Yes  ?  Order Specific Question:   Preferred imaging location?  ?  Answer:   [facility]  ?  Order Specific Question:   Release to patient  ?  Answer:   Immediate  ? ?The patient has a good understanding of the overall plan. she agrees with it. she will call with any problems that may develop before the next visit here. ?Total time spent: 30 mins including face to face time and time spent for planning, charting and co-ordination of care ? ? Harriette Ohara, MD ?12/28/21 ? ? ? I Gardiner Coins am scribing for Dr. Lindi Adie ? ?I have reviewed the above documentation for accuracy and completeness, and I agree with the above. ?  ?

## 2021-12-28 ENCOUNTER — Other Ambulatory Visit: Payer: Self-pay

## 2021-12-28 ENCOUNTER — Inpatient Hospital Stay: Payer: Medicare HMO | Attending: Hematology and Oncology | Admitting: Hematology and Oncology

## 2021-12-28 VITALS — BP 131/57 | HR 84 | Temp 97.3°F | Resp 18 | Ht 62.0 in | Wt 115.6 lb

## 2021-12-28 DIAGNOSIS — M4856XA Collapsed vertebra, not elsewhere classified, lumbar region, initial encounter for fracture: Secondary | ICD-10-CM | POA: Insufficient documentation

## 2021-12-28 DIAGNOSIS — C9000 Multiple myeloma not having achieved remission: Secondary | ICD-10-CM | POA: Insufficient documentation

## 2021-12-28 DIAGNOSIS — M4854XA Collapsed vertebra, not elsewhere classified, thoracic region, initial encounter for fracture: Secondary | ICD-10-CM | POA: Diagnosis not present

## 2021-12-28 DIAGNOSIS — M810 Age-related osteoporosis without current pathological fracture: Secondary | ICD-10-CM | POA: Insufficient documentation

## 2021-12-28 DIAGNOSIS — D472 Monoclonal gammopathy: Secondary | ICD-10-CM

## 2021-12-28 MED ORDER — OXYCODONE-ACETAMINOPHEN 5-325 MG PO TABS: 1.0000 | ORAL_TABLET | Freq: Four times a day (QID) | ORAL | 0 refills | Status: DC | PRN

## 2021-12-28 NOTE — Assessment & Plan Note (Signed)
Lab review ?11/20/2021: Urine protein electrophoresis: Abnormal protein band: 53 mg/24-hour, immunofixation: IgG kappa, ANA negative, hemoglobin 13.1, MCV 101, calcium 9.7, albumin 4.4, creatinine 0.9, SPEP: 1.3 g M spike IgG kappa ?MRI lumbar spine: 11/19/2021: L2 chronic mild anterior superior endplate fracture, Z7-Q9 biforaminal stenosis, L2-L3 spinal canal stenosis with disc protrusion, L3-L4 mild to moderate foraminal stenosis, L4-L5 severe facet arthropathy, L5-S1 spinal canal stenosis, facet arthropathy ? ?Bone marrow biopsy 12/21/2021: Mildly hypercellular bone marrow 40 to 50% cellularity with 16% plasma cells kappa light chain restricted (cytogenetics and FISH panel are pending) ? ?Counseling: I discussed with the patient of the bone marrow showing greater than 10% plasma cells is suggesting that the patient may have multiple myeloma.  It is still challenging to figure out if the bone findings on the MRI are related to myeloma or not. ? ?I would like the patient to be seen by her malignant hematology team to discuss the next steps in the approach to treatment of her monoclonal gammopathy. ?

## 2021-12-29 ENCOUNTER — Encounter: Payer: Self-pay | Admitting: Hematology and Oncology

## 2021-12-29 ENCOUNTER — Telehealth: Payer: Self-pay | Admitting: Hematology and Oncology

## 2021-12-29 NOTE — Telephone Encounter (Signed)
Scheduled appointment per 4/3 los. Patient is aware. ?

## 2021-12-31 ENCOUNTER — Encounter: Payer: Self-pay | Admitting: Hematology and Oncology

## 2021-12-31 ENCOUNTER — Encounter (HOSPITAL_COMMUNITY): Payer: Self-pay | Admitting: Hematology and Oncology

## 2022-01-11 LAB — SURGICAL PATHOLOGY

## 2022-01-13 ENCOUNTER — Encounter (HOSPITAL_COMMUNITY)
Admission: RE | Admit: 2022-01-13 | Discharge: 2022-01-13 | Disposition: A | Discharge status: Home / Self Care | Payer: Medicare HMO | Source: Ambulatory Visit | Attending: Hematology and Oncology | Admitting: Hematology and Oncology

## 2022-01-13 DIAGNOSIS — D472 Monoclonal gammopathy: Secondary | ICD-10-CM | POA: Diagnosis not present

## 2022-01-13 DIAGNOSIS — C9000 Multiple myeloma not having achieved remission: Secondary | ICD-10-CM | POA: Insufficient documentation

## 2022-01-13 LAB — GLUCOSE, CAPILLARY: Glucose-Capillary: 94 mg/dL (ref 70–99)

## 2022-01-13 IMAGING — PT NM PET IMAGE RESTAGE (PS) WHOLE BODY
7 series · 25 of 25 positions shown · non-contrast
Comparison: CT scan [DATE]

CLINICAL DATA: Initial treatment strategy for multiple myeloma.

EXAM:
NUCLEAR MEDICINE PET WHOLE BODY
TECHNIQUE: 6.04 mCi F-18 FDG was injected intravenously. Full-ring PET imaging
was performed from the head to foot after the radiotracer. CT data
was obtained and used for attenuation correction and anatomic
localization.
Fasting blood glucose: 94 mg/dl

[Series 3: pet wb ac · axial · 5.0mm · 4.07mm/px · z∈[-255,+1425]mm · 5 of 421 slices shown]
[im 1/421]
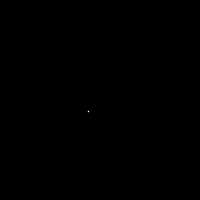
[im 106/421]
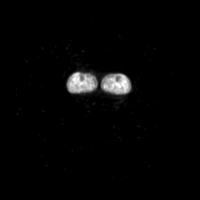
[im 211/421]
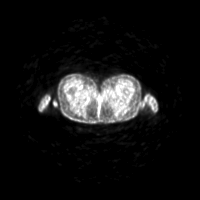
[im 316/421]
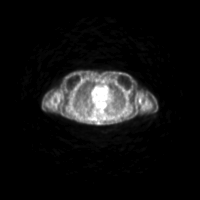
[im 421/421]
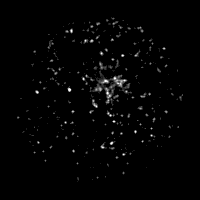

[Series 4: ct wb 5.0 hd_fov · axial · 5.0mm · 1.17mm/px · z∈[-255,+1425]mm · 5 of 419 slices shown]
[im 1/419]
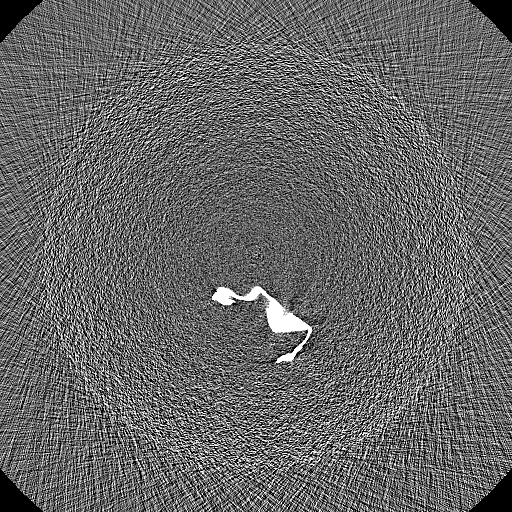
[im 105/419  soft-tissue]
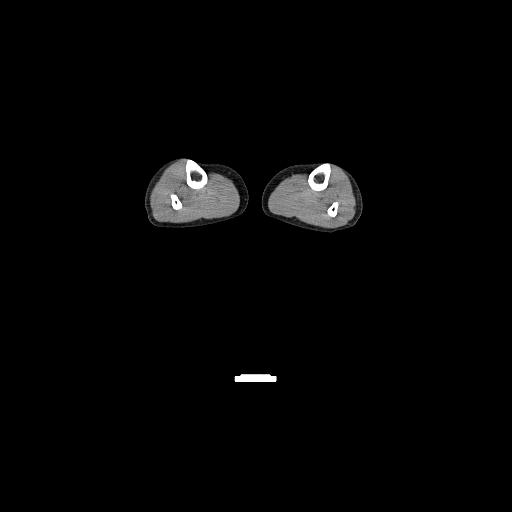
[im 210/419  soft-tissue]
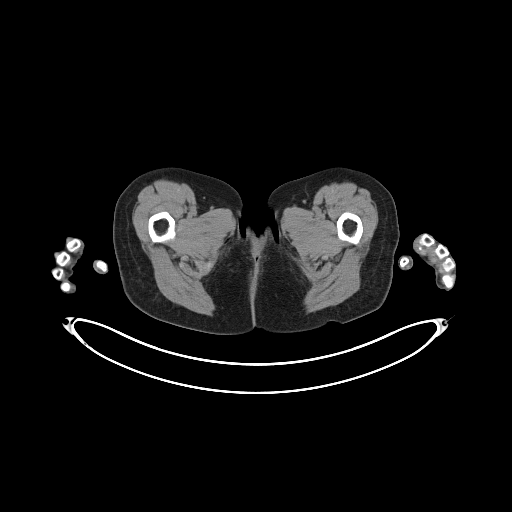
[im 314/419  soft-tissue]
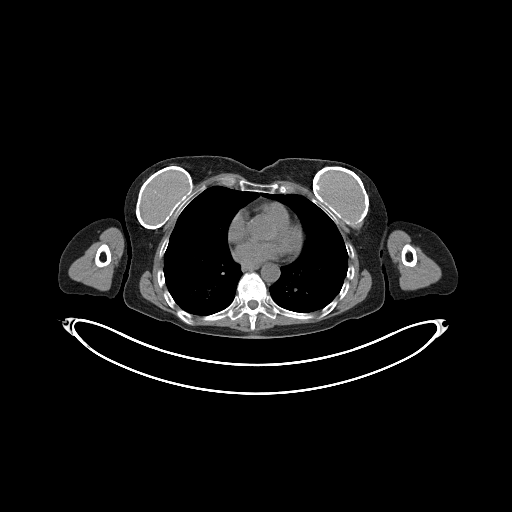
[im 419/419  soft-tissue]
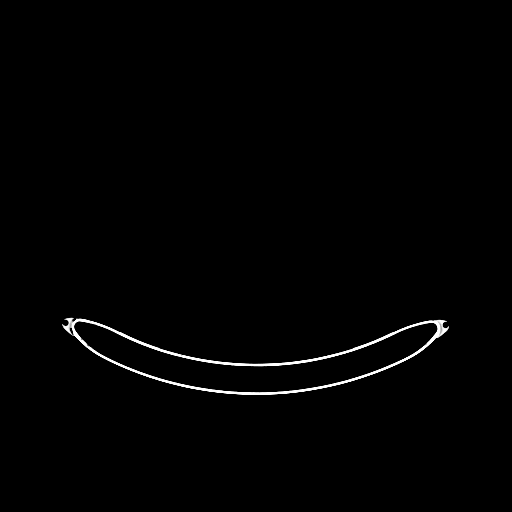

[Series 5: pet wb nac · axial · 5.0mm · 4.07mm/px · z∈[-247,+1425]mm · 6 of 419 slices shown]
[im 1/419]
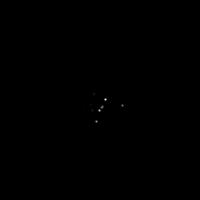
[im 84/419]
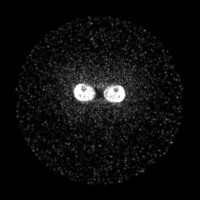
[im 168/419]
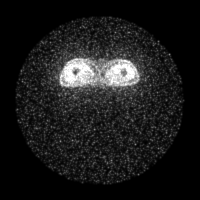
[im 251/419]
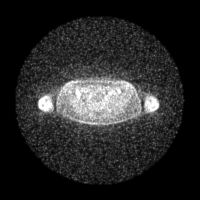
[im 335/419]
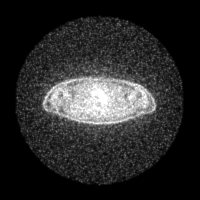
[im 419/419]
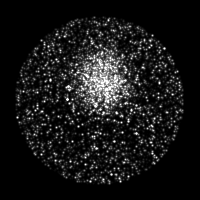

[Series 7: ct 5.0 bl57 lung_bone · axial · 5.0mm · 0.62mm/px · 1 of 63 slices shown]
[im 1/63  lung]
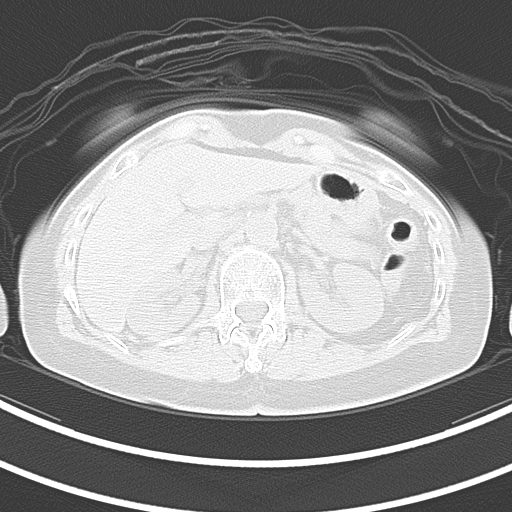

[Series 604: fused tra · 6 of 410 slices shown]
[im 1/410]
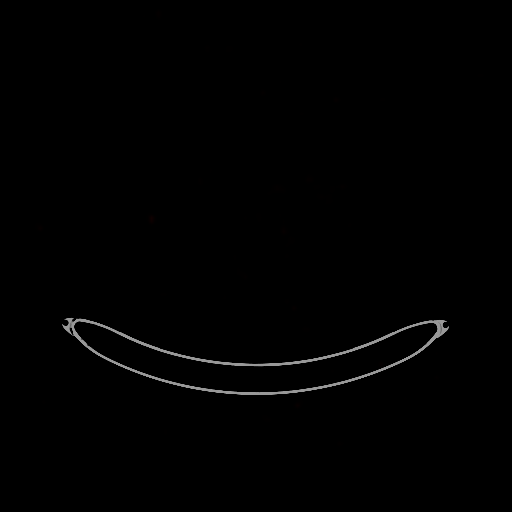
[im 82/410]
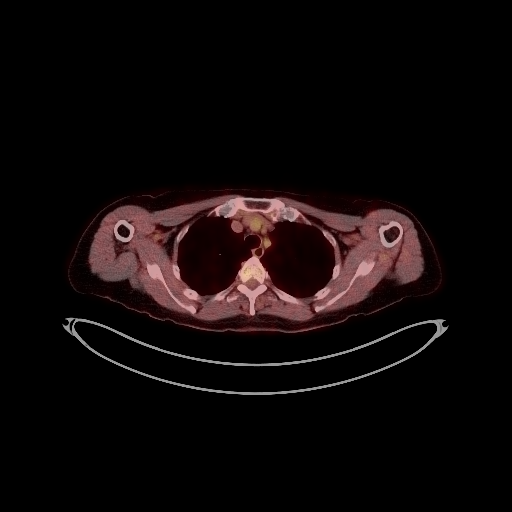
[im 164/410]
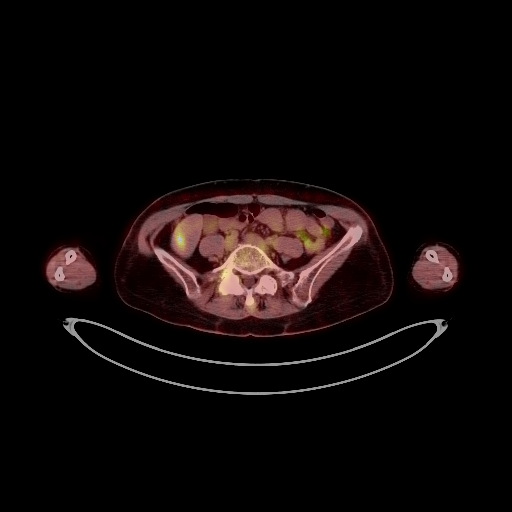
[im 246/410]
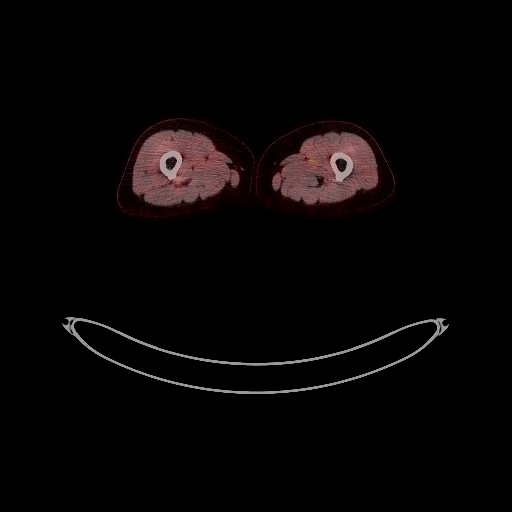
[im 328/410]
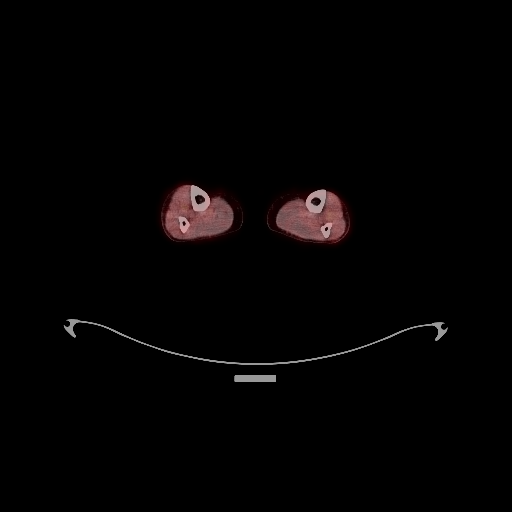
[im 410/410]
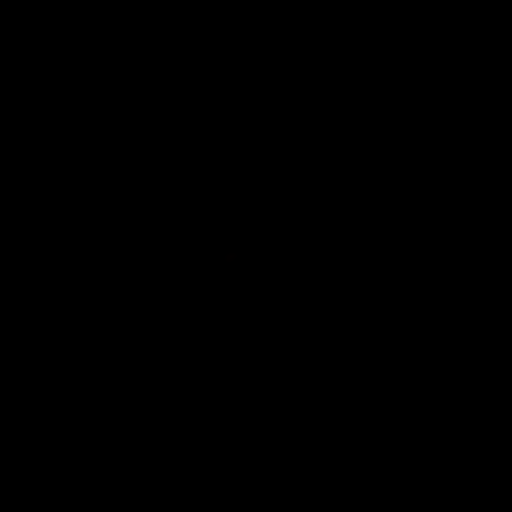

[Series 605: fused cor · 1 of 42 slices shown]
[im 1/42]
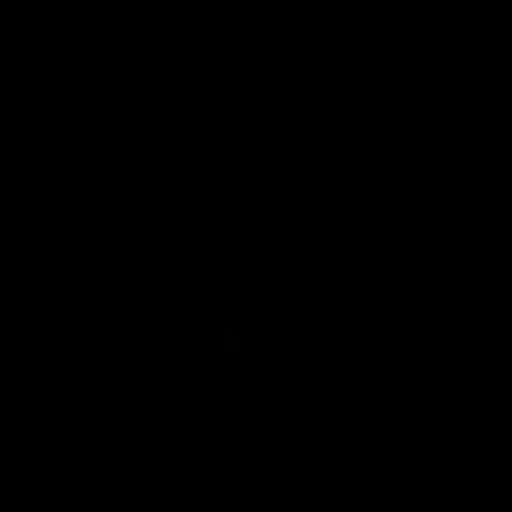

[Series 606: mip pet · coronal · 3.48mm/px · 1 of 32 slices shown]
[im 1/32]
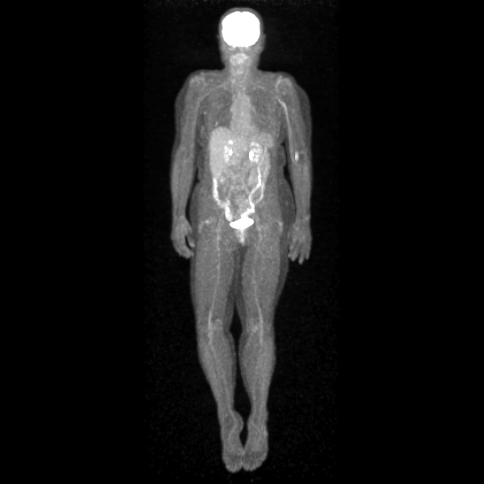

[25 of 25 positions shown; findings below may reference images not displayed]

FINDINGS: Mediastinal blood pool activity: SUV max

HEAD/NECK: No hypermetabolic activity in the scalp. No
hypermetabolic cervical lymph nodes.

Incidental CT findings: none

CHEST: No hypermetabolic mediastinal or hilar nodes. No suspicious
pulmonary nodules on the CT scan.

No hypermetabolic breast masses, supraclavicular or axillary
adenopathy.

Incidental CT findings: Densely rim calcified bilateral breast
prostheses.

ABDOMEN/PELVIS: No abnormal hypermetabolic activity within the
liver, pancreas, adrenal glands, or spleen. No hypermetabolic lymph
nodes in the abdomen or pelvis.

Incidental CT findings: Moderate scattered atherosclerotic
calcifications involving the aorta and iliac arteries. No aneurysm.
Sigmoid colon anastomosis noted.

SKELETON: No hypermetabolic bone lesions are identified. No obvious
lytic myelomatous bone lesions on the CT scan.

Incidental CT findings: none

EXTREMITIES: No abnormal hypermetabolic activity in the lower
extremities.

Incidental CT findings: none
IMPRESSION: 1. No hypermetabolic or lytic myelomatous lesions identified.
2. No significant areas of abnormal hypermetabolism involving the
chest, abdomen or pelvis.
3. Age advanced atherosclerotic calcifications.

## 2022-01-13 MED ORDER — FLUDEOXYGLUCOSE F - 18 (FDG) INJECTION
6.0400 | Freq: Once | INTRAVENOUS | Status: AC
  Administered 2022-01-13: 6.04 via INTRAVENOUS

## 2022-01-20 ENCOUNTER — Inpatient Hospital Stay: Payer: Medicare HMO | Admitting: Hematology

## 2022-01-20 ENCOUNTER — Inpatient Hospital Stay: Payer: Medicare HMO

## 2022-01-20 ENCOUNTER — Other Ambulatory Visit: Payer: Self-pay

## 2022-01-20 VITALS — BP 132/61 | HR 71 | Temp 97.2°F | Resp 18 | Wt 117.2 lb

## 2022-01-20 DIAGNOSIS — S22000D Wedge compression fracture of unspecified thoracic vertebra, subsequent encounter for fracture with routine healing: Secondary | ICD-10-CM

## 2022-01-20 DIAGNOSIS — M4856XA Collapsed vertebra, not elsewhere classified, lumbar region, initial encounter for fracture: Secondary | ICD-10-CM | POA: Diagnosis not present

## 2022-01-20 DIAGNOSIS — D472 Monoclonal gammopathy: Secondary | ICD-10-CM

## 2022-01-20 DIAGNOSIS — M4854XA Collapsed vertebra, not elsewhere classified, thoracic region, initial encounter for fracture: Secondary | ICD-10-CM | POA: Diagnosis not present

## 2022-01-20 DIAGNOSIS — C9000 Multiple myeloma not having achieved remission: Secondary | ICD-10-CM | POA: Diagnosis not present

## 2022-01-20 DIAGNOSIS — M810 Age-related osteoporosis without current pathological fracture: Secondary | ICD-10-CM | POA: Diagnosis not present

## 2022-01-20 LAB — CBC WITH DIFFERENTIAL/PLATELET
Abs Immature Granulocytes: 0.01 10*3/uL (ref 0.00–0.07)
Basophils Absolute: 0 10*3/uL (ref 0.0–0.1)
Basophils Relative: 1 %
Eosinophils Absolute: 0 10*3/uL (ref 0.0–0.5)
Eosinophils Relative: 1 %
HCT: 37.3 % (ref 36.0–46.0)
Hemoglobin: 12.8 g/dL (ref 12.0–15.0)
Immature Granulocytes: 0 %
Lymphocytes Relative: 40 %
Lymphs Abs: 1.4 10*3/uL (ref 0.7–4.0)
MCH: 34.8 pg — ABNORMAL HIGH (ref 26.0–34.0)
MCHC: 34.3 g/dL (ref 30.0–36.0)
MCV: 101.4 fL — ABNORMAL HIGH (ref 80.0–100.0)
Monocytes Absolute: 0.2 10*3/uL (ref 0.1–1.0)
Monocytes Relative: 6 %
Neutro Abs: 1.9 10*3/uL (ref 1.7–7.7)
Neutrophils Relative %: 52 %
Platelets: 217 10*3/uL (ref 150–400)
RBC: 3.68 MIL/uL — ABNORMAL LOW (ref 3.87–5.11)
RDW: 12.4 % (ref 11.5–15.5)
WBC: 3.6 10*3/uL — ABNORMAL LOW (ref 4.0–10.5)
nRBC: 0 % (ref 0.0–0.2)

## 2022-01-20 LAB — CMP (CANCER CENTER ONLY)
ALT: 31 U/L (ref 0–44)
AST: 24 U/L (ref 15–41)
Albumin: 4.5 g/dL (ref 3.5–5.0)
Alkaline Phosphatase: 74 U/L (ref 38–126)
Anion gap: 7 (ref 5–15)
BUN: 12 mg/dL (ref 8–23)
CO2: 29 mmol/L (ref 22–32)
Calcium: 9.9 mg/dL (ref 8.9–10.3)
Chloride: 104 mmol/L (ref 98–111)
Creatinine: 0.91 mg/dL (ref 0.44–1.00)
GFR, Estimated: >60 mL/min (ref >60)
Glucose, Bld: 95 mg/dL (ref 70–99)
Potassium: 4.2 mmol/L (ref 3.5–5.1)
Sodium: 140 mmol/L (ref 135–145)
Total Bilirubin: 0.5 mg/dL (ref 0.3–1.2)
Total Protein: 7.5 g/dL (ref 6.5–8.1)

## 2022-01-20 LAB — VITAMIN D 25 HYDROXY (VIT D DEFICIENCY, FRACTURES): Vit D, 25-Hydroxy: 60.27 ng/mL (ref 30–100)

## 2022-01-20 LAB — LACTATE DEHYDROGENASE: LDH: 127 U/L (ref 98–192)

## 2022-01-20 NOTE — Progress Notes (Signed)
?Hematology oncology outpatient clinic note ?[employer] ?Patient Care Team: ?London Pepper, MD as PCP - General (Family Medicine) ? ?DIAGNOSIS:  ?Newly diagnosed smoldering myeloma ? ?CHIEF COMPLIANT: Referred for continued evaluation and management of plasma cell dyscrasia. ? ?INTERVAL HISTORY: Kiara Olson is a 66 y.o. female who has been referred to [place] by Dr.Gudena for continued valuation and management of her recently diagnosed plasma cell dyscrasia. ?She had presented with increased mid back pain and was seen at [facility] and had MRI of the thoracic and lumbar spine which showed multilevel compression deformities at T9 and T11 as well as L2. ?She was referred to oncology for further evaluation of this. ?Patient had a CT scan of the chest abdomen pelvis on 12/22/2021 which showed age-indeterminate compression fractures at T9, T11 and L2. ?Patient's myeloma labs on 12/18/2021 showed M spike of 1 g/dL of IgG kappa monoclonal protein as well as increase in kappa free light chains with abnormal kappa lambda ratio of 70. ?CBC showed no anemia with a hemoglobin of 12.7 normal WBC count and normal platelets ? ?She subsequently had a bone marrow biopsy done on 12/21/2021 which showed mildly hypercellular marrow involved by kappa restricted plasma cell neoplasm with about 15% plasma cells by CD138 staining. ? ?Patient subsequently had a PET CT scan which we discussed which showed no hypermetabolic or lytic myelomatous lesion identified.  No significant areas of abnormal hypermetabolism involving the chest abdomen or pelvis. ? ?Patient is still having intermittent pain in her mid back. ? ?We discussed that based on absence of any active myelomatous lesions on the PET CT scan, no anemia or obvious renal failure or hypercalcemia she appears to likely have smoldering myeloma. ? ?We discussed and patient is agreeable with getting a DEXA scan and referral to IR for consideration of  vertebroplasty. ? ? ?ALLERGIES:  is allergic to lidocaine. ? ?MEDICATIONS:  ?Current Outpatient Medications  ?Medication Sig Dispense Refill  ? ARMOUR THYROID 30 MG tablet Take 30 mg by mouth every morning.    ? Biotin 10000 MCG TABS Take by mouth.    ? Cyanocobalamin (B-12) 2500 MCG SUBL Place 2,500 mcg under the tongue daily.    ? fluticasone (FLONASE) 50 MCG/ACT nasal spray Place 1 spray into both nostrils daily as needed for allergies or rhinitis.    ? LORazepam (ATIVAN) 1 MG tablet Take 1.5 mg by mouth at bedtime.    ? OVER THE COUNTER MEDICATION Apply 1 application topically daily. Magnesium oil    ? oxyCODONE-acetaminophen (PERCOCET/ROXICET) 5-325 MG tablet Take 1 tablet by mouth every 6 (six) hours as needed for severe pain. 60 tablet 0  ? Potassium (POTASSIMIN PO) Place 10 drops under the tongue daily.    ? Vitamin D-Vitamin K (VITAMIN K2-VITAMIN D3 PO) Place 2.5 drops under the tongue daily.    ? ?No current facility-administered medications for this visit.  ? ? ?PHYSICAL EXAMINATION: ?ECOG PERFORMANCE STATUS: 1 - Symptomatic but completely ambulatory ? ?Vitals:  ? 01/20/22 1048  ?BP: 132/61  ?Pulse: 71  ?Resp: 18  ?Temp: (!) 97.2 ?F (36.2 ?C)  ?SpO2: 98%  ? ?Filed Weights  ? 01/20/22 1048  ?Weight: 117 lb 3.2 oz (53.2 kg)  ? ?NAD ?GENERAL:alert, in no acute distress and comfortable ?SKIN: no acute rashes, no significant lesions ?EYES: conjunctiva are pink and non-injected, sclera anicteric ?OROPHARYNX: MMM, no exudates, no oropharyngeal erythema or ulceration ?NECK: supple, no JVD ?LYMPH:  no palpable lymphadenopathy in the cervical, axillary or inguinal regions ?  LUNGS: clear to auscultation b/l with normal respiratory effort ?HEART: regular rate & rhythm ?ABDOMEN:  normoactive bowel sounds , non tender, not distended. ?Extremity: no pedal edema ?PSYCH: alert & oriented x 3 with fluent speech ?NEURO: no focal motor/sensory deficits ? ? ?LABORATORY DATA:  ?I have reviewed the data as listed ?. ? ?  Latest  Ref Rng & Units 01/20/2022  ? 12:05 PM 12/21/2021  ?  8:47 AM 12/18/2021  ?  2:00 PM  ?CBC  ?WBC 4.0 - 10.5 K/uL 3.6   3.7   4.6    ?Hemoglobin 12.0 - 15.0 g/dL 12.8   12.2   12.7    ?Hematocrit 36.0 - 46.0 % 37.3   34.9   37.1    ?Platelets 150 - 400 K/uL 217   198   218    ? ? ?Lab Results  ?Component Value Date  ? WBC 3.7 (L) 12/21/2021  ? HGB 12.2 12/21/2021  ? HCT 34.9 (L) 12/21/2021  ? MCV 98.6 12/21/2021  ? PLT 198 12/21/2021  ? NEUTROABS 2.0 12/21/2021  ? ?. ? ?  Latest Ref Rng & Units 01/20/2022  ? 12:05 PM 12/22/2021  ?  2:18 PM 12/11/2018  ?  4:02 AM  ?CMP  ?Glucose 70 - 99 mg/dL 95    102    ?BUN 8 - 23 mg/dL 12    9    ?Creatinine 0.44 - 1.00 mg/dL 0.91   1.00   0.76    ?Sodium 135 - 145 mmol/L 140    140    ?Potassium 3.5 - 5.1 mmol/L 4.2    4.0    ?Chloride 98 - 111 mmol/L 104    108    ?CO2 22 - 32 mmol/L 29    27    ?Calcium 8.9 - 10.3 mg/dL 9.9    8.3    ?Total Protein 6.5 - 8.1 g/dL 7.5      ?Total Bilirubin 0.3 - 1.2 mg/dL 0.5      ?Alkaline Phos 38 - 126 U/L 74      ?AST 15 - 41 U/L 24      ?ALT 0 - 44 U/L 31      ? ? ? ? ?ASSESSMENT & PLAN:  ? ?66 year old female with ? ?#1 newly diagnosed smoldering myeloma ? ?CBC shows normal blood counts with no anemia ?Previous CMP with normal renal function no hypercalcemia ? ?Bone marrow biopsy 12/21/2021: Mildly hypercellular bone marrow 40 to 50% cellularity with 16% plasma cells kappa light chain restricted (cytogenetics and FISH panel are pending) ? ?Plan ?-I discussed all the work-up the patient has had thus far including her labs. ?-We discussed her previous bone marrow biopsy which showed 16% kappa restricted plasma cells consistent with smoldering myeloma. ?-Had a PET CT scan on 01/14/2022 which showed no evidence of hypermetabolic or lytic myeloma osseous or extraosseous lesions. ?-We discussed monitoring her myeloma closely with repeat labs. ?-No indication for treatment of the patient's smoldering myeloma at this time. ? ?#2 patient with multilevel  compression fractures with back pain ?-We will refer the patient to IR for consideration of vertebroplasty for pain management.  If vertebroplasty is considered patient could potentially have a biopsy of her fracture area with osteochondral ablation and vertebroplasty. ?-We will get a DEXA scan. ? ?Follow-up ?Labs today ?Referral to IR in 1 week for consideration of vertebroplasty ?DEXA scan in 1 week ?RTC with Dr Irene Limbo in 4 weeks with labs ? ?Orders Placed This Encounter  ?  Procedures  ? DG BONE DENSITY (DXA)  ?  Standing Status:   Future  ?  Standing Expiration Date:   01/27/2023  ?  Order Specific Question:   Reason for Exam (SYMPTOM  OR DIAGNOSIS REQUIRED)  ?  Answer:   patient with smoldering myeloma with vertebral compression fractures concerning for osteoporosis  ?  Order Specific Question:   Preferred imaging location?  ?  Answer:   [facility]  ? CBC with Differential/Platelet  ?  Standing Status:   Future  ?  Number of Occurrences:   1  ?  Standing Expiration Date:   01/21/2023  ? CMP ([place] only)  ?  Standing Status:   Future  ?  Number of Occurrences:   1  ?  Standing Expiration Date:   01/21/2023  ? Lactate dehydrogenase  ?  Standing Status:   Future  ?  Number of Occurrences:   1  ?  Standing Expiration Date:   01/21/2023  ? Vitamin D 25 hydroxy  ?  Standing Status:   Future  ?  Number of Occurrences:   1  ?  Standing Expiration Date:   01/21/2023  ? Multiple Myeloma Panel (SPEP&IFE w/QIG)  ?  Standing Status:   Future  ?  Number of Occurrences:   1  ?  Standing Expiration Date:   01/21/2023  ? Kappa/lambda light chains  ?  Standing Status:   Future  ?  Number of Occurrences:   1  ?  Standing Expiration Date:   01/21/2023  ? Ambulatory referral to Interventional Radiology  ?  Referral Priority:   Urgent  ?  Referral Type:   Consultation  ?  Referral Reason:   Specialty Services Required  ?  Requested Specialty:   Interventional Radiology  ?  Number of Visits Requested:   1  ? ? ?The total time spent  in the appointment was 41 minutes*. ? ?All of the patient's questions were answered with apparent satisfaction. The patient knows to call the clinic with any problems, questions or concerns. ? ? ?Sullivan Lone MD MS

## 2022-01-21 LAB — KAPPA/LAMBDA LIGHT CHAINS
Kappa free light chain: 374.7 mg/L — ABNORMAL HIGH (ref 3.3–19.4)
Kappa, lambda light chain ratio: 65.74 — ABNORMAL HIGH (ref 0.26–1.65)
Lambda free light chains: 5.7 mg/L (ref 5.7–26.3)

## 2022-01-22 LAB — MULTIPLE MYELOMA PANEL, SERUM
Albumin SerPl Elph-Mcnc: 4.4 g/dL (ref 2.9–4.4)
Albumin/Glob SerPl: 1.7 (ref 0.7–1.7)
Alpha 1: 0.1 g/dL (ref 0.0–0.4)
Alpha2 Glob SerPl Elph-Mcnc: 0.7 g/dL (ref 0.4–1.0)
B-Globulin SerPl Elph-Mcnc: 1.7 g/dL — ABNORMAL HIGH (ref 0.7–1.3)
Gamma Glob SerPl Elph-Mcnc: 0.2 g/dL — ABNORMAL LOW (ref 0.4–1.8)
Globulin, Total: 2.7 g/dL (ref 2.2–3.9)
IgA: 39 mg/dL — ABNORMAL LOW (ref 87–352)
IgG (Immunoglobin G), Serum: 1096 mg/dL (ref 586–1602)
IgM (Immunoglobulin M), Srm: 34 mg/dL (ref 26–217)
M Protein SerPl Elph-Mcnc: 1.2 g/dL — ABNORMAL HIGH
Total Protein ELP: 7.1 g/dL (ref 6.0–8.5)

## 2022-01-26 ENCOUNTER — Encounter: Payer: Self-pay | Admitting: Hematology

## 2022-01-26 DIAGNOSIS — M549 Dorsalgia, unspecified: Secondary | ICD-10-CM | POA: Diagnosis not present

## 2022-01-26 DIAGNOSIS — E039 Hypothyroidism, unspecified: Secondary | ICD-10-CM | POA: Diagnosis not present

## 2022-01-26 DIAGNOSIS — E785 Hyperlipidemia, unspecified: Secondary | ICD-10-CM | POA: Diagnosis not present

## 2022-01-26 DIAGNOSIS — D472 Monoclonal gammopathy: Secondary | ICD-10-CM | POA: Diagnosis not present

## 2022-01-26 DIAGNOSIS — G4700 Insomnia, unspecified: Secondary | ICD-10-CM | POA: Diagnosis not present

## 2022-02-03 ENCOUNTER — Other Ambulatory Visit: Payer: Self-pay

## 2022-02-03 DIAGNOSIS — S22000D Wedge compression fracture of unspecified thoracic vertebra, subsequent encounter for fracture with routine healing: Secondary | ICD-10-CM

## 2022-02-03 NOTE — Progress Notes (Signed)
Pt contacted regarding my chart message. Pt reassured IR would be calling her. IR verified they will call. ?

## 2022-02-05 ENCOUNTER — Inpatient Hospital Stay: Admission: RE | Admit: 2022-02-05 | Payer: Medicare HMO | Source: Ambulatory Visit

## 2022-02-05 ENCOUNTER — Other Ambulatory Visit: Payer: Self-pay | Admitting: Hematology

## 2022-02-05 DIAGNOSIS — S22000D Wedge compression fracture of unspecified thoracic vertebra, subsequent encounter for fracture with routine healing: Secondary | ICD-10-CM

## 2022-02-10 ENCOUNTER — Other Ambulatory Visit: Payer: Self-pay | Admitting: Orthopedic Surgery

## 2022-02-10 ENCOUNTER — Ambulatory Visit
Admission: RE | Admit: 2022-02-10 | Discharge: 2022-02-10 | Disposition: A | Discharge status: Home / Self Care | Payer: Self-pay | Source: Ambulatory Visit | Attending: Orthopedic Surgery | Admitting: Orthopedic Surgery

## 2022-02-10 DIAGNOSIS — R52 Pain, unspecified: Secondary | ICD-10-CM

## 2022-02-12 ENCOUNTER — Other Ambulatory Visit: Payer: Self-pay | Admitting: Interventional Radiology

## 2022-02-12 ENCOUNTER — Encounter: Payer: Self-pay | Admitting: *Deleted

## 2022-02-12 ENCOUNTER — Ambulatory Visit
Admission: RE | Admit: 2022-02-12 | Discharge: 2022-02-12 | Disposition: A | Discharge status: Home / Self Care | Payer: Medicare HMO | Source: Ambulatory Visit | Attending: Hematology | Admitting: Hematology

## 2022-02-12 DIAGNOSIS — M4844XA Fatigue fracture of vertebra, thoracic region, initial encounter for fracture: Secondary | ICD-10-CM | POA: Diagnosis not present

## 2022-02-12 DIAGNOSIS — S22000G Wedge compression fracture of unspecified thoracic vertebra, subsequent encounter for fracture with delayed healing: Secondary | ICD-10-CM

## 2022-02-12 DIAGNOSIS — S32010G Wedge compression fracture of first lumbar vertebra, subsequent encounter for fracture with delayed healing: Secondary | ICD-10-CM

## 2022-02-12 DIAGNOSIS — S22000D Wedge compression fracture of unspecified thoracic vertebra, subsequent encounter for fracture with routine healing: Secondary | ICD-10-CM

## 2022-02-12 HISTORY — PX: IR RADIOLOGIST EVAL & MGMT: IMG5224

## 2022-02-12 NOTE — Consult Note (Signed)
Chief Complaint: Patient was seen in consultation today for thoracic and lumbar back pain in the setting of subacute thoracic insufficiency fracture at the request of Brunetta Genera  Referring Physician(s): Brunetta Genera  History of Present Illness: Kiara Olson is a 66 y.o. female with a history of smoldering multiple myeloma.  She has had intermittent episodes with back pain over the past year, but since this past February she has dealt with severe pain in her lower thoracic spine at the level of her bra.  The pain is present from the time she wakes up until she goes to sleep at night and is significantly exacerbated anytime she is up and moving around.  The pain also radiates along the ribs.  Previously, the pain was fairly localized to this location and radiation pattern.  However, over the past several weeks, she has experienced new pain extending more inferiorly toward the waist.  This was not present at the time she had her prior MRI.  She reports that when she is up and moving and the pain becomes severe she would rated an 8 out of 10 on a 10 point pain scale.  The degree of pain limits her ability to perform her normal daily activities.  She is currently taking Tylenol, Motrin and Percocet for the pain.  She feels she has to take Percocet every day in order to manage the pain and is concerned that this may lead to dependency issues.  She does not want to take the narcotic long-term.  She denies lower extremity weakness, paresthesias or other symptoms of myelopathy.  Past Medical History:  Diagnosis Date   Alpha-1-antitrypsin deficiency ([place])    Moderate level   Aortic atherosclerosis ([place]) 2018   Diverticulitis 2018   GERD (gastroesophageal reflux disease)    Sleepers reflux   Hypothyroidism    Insomnia    Macrocytic anemia    Migraine    PONV (postoperative nausea and vomiting)    Seasonal allergies     Past Surgical History:  Procedure Laterality  Date   ABDOMINAL HYSTERECTOMY  1980   ABDOMINOPLASTY  1989   BREAST ENHANCEMENT SURGERY     COLONOSCOPY     X3   CYSTOSCOPY W/ URETERAL STENT PLACEMENT Bilateral 12/08/2018   Procedure: CYSTOSCOPY WITH BILATERAL RETROGRADE PYELOGRAM/BILATERAL URETERAL STENT PLACEMENT;  Surgeon: Alexis Frock, MD;  Location: [facility];  Service: Urology;  Laterality: Bilateral;   FLEXIBLE SIGMOIDOSCOPY N/A 12/08/2018   Procedure: FLEXIBLE SIGMOIDOSCOPY;  Surgeon: Ileana Roup, MD;  Location: [facility];  Service: General;  Laterality: N/A;   IR RADIOLOGIST EVAL & MGMT  02/12/2022    Allergies: Lidocaine  Medications: Prior to Admission medications   Medication Sig Start Date End Date Taking? Authorizing Provider  ARMOUR THYROID 30 MG tablet Take 30 mg by mouth every morning. 10/27/18   [provider]  Biotin 10000 MCG TABS Take by mouth.    [provider]  Cyanocobalamin (B-12) 2500 MCG SUBL Place 2,500 mcg under the tongue daily.    [provider]  fluticasone (FLONASE) 50 MCG/ACT nasal spray Place 1 spray into both nostrils daily as needed for allergies or rhinitis.    [provider]  LORazepam (ATIVAN) 1 MG tablet Take 1.5 mg by mouth at bedtime. 10/15/18   [provider]  OVER THE COUNTER MEDICATION Apply 1 application topically daily. Magnesium oil    [provider]  oxyCODONE-acetaminophen (PERCOCET/ROXICET) 5-325 MG tablet Take 1 tablet by mouth every 6 (  six) hours as needed for severe pain. 12/28/21   Nicholas Lose, MD  Potassium (POTASSIMIN PO) Place 10 drops under the tongue daily.    [provider]  Vitamin D-Vitamin K (VITAMIN K2-VITAMIN D3 PO) Place 2.5 drops under the tongue daily.    [provider]     No family history on file.  Social History   Socioeconomic History   Marital status: Widowed    Spouse name: Not on file   Number of children: Not on file   Years of education: Not on file   Highest  education level: Not on file  Occupational History   Not on file  Tobacco Use   Smoking status: Former    Packs/day: 1.00    Years: 10.00    Pack years: 10.00    Types: Cigarettes   Smokeless tobacco: Never  Vaping Use   Vaping Use: Never used  Substance and Sexual Activity   Alcohol use: Yes    Comment: rare   Drug use: Never   Sexual activity: Not on file  Other Topics Concern   Not on file  Social History Narrative   Not on file   Social Determinants of Health   Financial Resource Strain: Not on file  Food Insecurity: Not on file  Transportation Needs: Not on file  Physical Activity: Not on file  Stress: Not on file  Social Connections: Not on file    ECOG Status: 1 - Symptomatic but completely ambulatory  Review of Systems: A 12 point ROS discussed and pertinent positives are indicated in the HPI above.  All other systems are negative.  Review of Systems  Vital Signs: BP 139/61 (BP Location: Left Arm)   Pulse 61   SpO2 99%   Physical Exam Constitutional:      General: She is not in acute distress.    Appearance: Normal appearance.  HENT:     Head: Normocephalic and atraumatic.  Eyes:     General: No scleral icterus. Cardiovascular:     Rate and Rhythm: Normal rate.  Pulmonary:     Effort: Pulmonary effort is normal.  Abdominal:     General: Abdomen is flat.     Palpations: Abdomen is soft.     Tenderness: There is no abdominal tenderness.  Musculoskeletal:     Comments: No significant focal TTP along the spinous processes.   Skin:    General: Skin is warm and dry.  Neurological:     Mental Status: She is alert and oriented to person, place, and time.  Psychiatric:        Mood and Affect: Mood normal.        Behavior: Behavior normal.      Imaging: NM PET Image Restage (PS) Whole Body  Result Date: 01/14/2022 CLINICAL DATA:  Initial treatment strategy for multiple myeloma. EXAM: NUCLEAR MEDICINE PET WHOLE BODY TECHNIQUE: 6.04 mCi F-18 FDG  was injected intravenously. Full-ring PET imaging was performed from the head to foot after the radiotracer. CT data was obtained and used for attenuation correction and anatomic localization. Fasting blood glucose: 94 mg/dl COMPARISON:  CT scan 12/22/2021 FINDINGS: Mediastinal blood pool activity: SUV max 2.14 HEAD/NECK: No hypermetabolic activity in the scalp. No hypermetabolic cervical lymph nodes. Incidental CT findings: none CHEST: No hypermetabolic mediastinal or hilar nodes. No suspicious pulmonary nodules on the CT scan. No hypermetabolic breast masses, supraclavicular or axillary adenopathy. Incidental CT findings: Densely rim calcified bilateral breast prostheses. ABDOMEN/PELVIS: No abnormal hypermetabolic activity within the  liver, pancreas, adrenal glands, or spleen. No hypermetabolic lymph nodes in the abdomen or pelvis. Incidental CT findings: Moderate scattered atherosclerotic calcifications involving the aorta and iliac arteries. No aneurysm. Sigmoid colon anastomosis noted. SKELETON: No hypermetabolic bone lesions are identified. No obvious lytic myelomatous bone lesions on the CT scan. Incidental CT findings: none EXTREMITIES: No abnormal hypermetabolic activity in the lower extremities. Incidental CT findings: none IMPRESSION: 1. No hypermetabolic or lytic myelomatous lesions identified. 2. No significant areas of abnormal hypermetabolism involving the chest, abdomen or pelvis. 3. Age advanced atherosclerotic calcifications. Electronically Signed   By: Marijo Sanes M.D.   On: 01/14/2022 13:32   IR Radiologist Eval & Mgmt  Result Date: 02/12/2022 Please refer to notes tab for details about interventional procedure. (Op Note)   Labs:  CBC: Recent Labs    12/18/21 1400 12/21/21 0847 01/20/22 1205  WBC 4.6 3.7* 3.6*  HGB 12.7 12.2 12.8  HCT 37.1 34.9* 37.3  PLT 218 198 217    COAGS: No results for input(s): INR, APTT in the last 8760 hours.  BMP: Recent Labs    12/22/21 1418  01/20/22 1205  NA  --  140  K  --  4.2  CL  --  104  CO2  --  29  GLUCOSE  --  95  BUN  --  12  CALCIUM  --  9.9  CREATININE 1.00 0.91  GFRNONAA  --  >60    LIVER FUNCTION TESTS: Recent Labs    01/20/22 1205  BILITOT 0.5  AST 24  ALT 31  ALKPHOS 74  PROT 7.5  ALBUMIN 4.5    TUMOR MARKERS: No results for input(s): AFPTM, CEA, CA199, CHROMGRNA in the last 8760 hours.  Assessment and Plan:  Very pleasant 66 year old female with a definitive unhealed symptomatic T9 fracture, and also questionable fractures at T11, and L2.  Her symptoms have been fluctuating and progressive over the past 10-12 weeks since her prior MRI imaging.  Given the length of time, and the changing symptoms, it is impossible to tell if her fracture is truly isolated at T9, or if she has other areas of active, unhealed fracture.  To best differentiate this before we pursue treatment, I believe repeat MRI imaging is medically necessary.  1.)  MRI thoracic and lumbar spine without gadolinium contrast. 2.)  Follow-up phone call to discuss MRI results and further plan. 3.)  If fractures remain unhealed with T2/STIR signal intensity in the region of fracture, we will likely proceed with cement augmentation with balloon kyphoplasty as I think she is an excellent candidate given her persistent and severe pain.  We will treat any lesions with active/unhealed fracture. 4.)  If fractures have healed, we will pursue additional conservative pain management with physical therapy.  Thank you for this interesting consult.  I greatly enjoyed meeting KAIESHA TONNER and look forward to participating in their care.  A copy of this report was sent to the requesting provider on this date.  Electronically Signed: Criselda Peaches 02/12/2022, 11:51 AM   I spent a total of  40 Minutes  in face to face in clinical consultation, greater than 50% of which was counseling/coordinating care for thoracic and lumbar back pain in the  setting of subacute thoracic insufficiency fracture.

## 2022-02-16 ENCOUNTER — Other Ambulatory Visit: Payer: Self-pay

## 2022-02-16 ENCOUNTER — Other Ambulatory Visit: Payer: Medicare HMO

## 2022-02-16 DIAGNOSIS — D472 Monoclonal gammopathy: Secondary | ICD-10-CM

## 2022-02-17 ENCOUNTER — Inpatient Hospital Stay: Payer: Medicare HMO | Attending: Hematology and Oncology

## 2022-02-17 ENCOUNTER — Inpatient Hospital Stay (HOSPITAL_BASED_OUTPATIENT_CLINIC_OR_DEPARTMENT_OTHER): Payer: Medicare HMO | Admitting: Hematology

## 2022-02-17 ENCOUNTER — Ambulatory Visit (HOSPITAL_COMMUNITY)
Admission: RE | Admit: 2022-02-17 | Discharge: 2022-02-17 | Disposition: A | Discharge status: Home / Self Care | Payer: Medicare HMO | Source: Ambulatory Visit | Attending: Interventional Radiology | Admitting: Interventional Radiology

## 2022-02-17 VITALS — BP 121/51 | HR 65 | Temp 97.5°F | Resp 18 | Wt 115.0 lb

## 2022-02-17 DIAGNOSIS — S22000D Wedge compression fracture of unspecified thoracic vertebra, subsequent encounter for fracture with routine healing: Secondary | ICD-10-CM | POA: Diagnosis not present

## 2022-02-17 DIAGNOSIS — S32010G Wedge compression fracture of first lumbar vertebra, subsequent encounter for fracture with delayed healing: Secondary | ICD-10-CM | POA: Insufficient documentation

## 2022-02-17 DIAGNOSIS — M549 Dorsalgia, unspecified: Secondary | ICD-10-CM | POA: Insufficient documentation

## 2022-02-17 DIAGNOSIS — C9000 Multiple myeloma not having achieved remission: Secondary | ICD-10-CM | POA: Insufficient documentation

## 2022-02-17 DIAGNOSIS — D472 Monoclonal gammopathy: Secondary | ICD-10-CM

## 2022-02-17 DIAGNOSIS — Z79891 Long term (current) use of opiate analgesic: Secondary | ICD-10-CM | POA: Insufficient documentation

## 2022-02-17 DIAGNOSIS — S22000G Wedge compression fracture of unspecified thoracic vertebra, subsequent encounter for fracture with delayed healing: Secondary | ICD-10-CM | POA: Diagnosis not present

## 2022-02-17 DIAGNOSIS — S32010A Wedge compression fracture of first lumbar vertebra, initial encounter for closed fracture: Secondary | ICD-10-CM | POA: Diagnosis not present

## 2022-02-17 LAB — CBC WITH DIFFERENTIAL (CANCER CENTER ONLY)
Abs Immature Granulocytes: 0.01 10*3/uL (ref 0.00–0.07)
Basophils Absolute: 0 10*3/uL (ref 0.0–0.1)
Basophils Relative: 0 %
Eosinophils Absolute: 0 10*3/uL (ref 0.0–0.5)
Eosinophils Relative: 0 %
HCT: 37.5 % (ref 36.0–46.0)
Hemoglobin: 13.2 g/dL (ref 12.0–15.0)
Immature Granulocytes: 0 %
Lymphocytes Relative: 32 %
Lymphs Abs: 1.5 10*3/uL (ref 0.7–4.0)
MCH: 35.2 pg — ABNORMAL HIGH (ref 26.0–34.0)
MCHC: 35.2 g/dL (ref 30.0–36.0)
MCV: 100 fL (ref 80.0–100.0)
Monocytes Absolute: 0.3 10*3/uL (ref 0.1–1.0)
Monocytes Relative: 6 %
Neutro Abs: 2.9 10*3/uL (ref 1.7–7.7)
Neutrophils Relative %: 62 %
Platelet Count: 228 10*3/uL (ref 150–400)
RBC: 3.75 MIL/uL — ABNORMAL LOW (ref 3.87–5.11)
RDW: 12.3 % (ref 11.5–15.5)
WBC Count: 4.8 10*3/uL (ref 4.0–10.5)
nRBC: 0 % (ref 0.0–0.2)

## 2022-02-17 LAB — CMP (CANCER CENTER ONLY)
ALT: 21 U/L (ref 0–44)
AST: 20 U/L (ref 15–41)
Albumin: 4.7 g/dL (ref 3.5–5.0)
Alkaline Phosphatase: 74 U/L (ref 38–126)
Anion gap: 8 (ref 5–15)
BUN: 13 mg/dL (ref 8–23)
CO2: 28 mmol/L (ref 22–32)
Calcium: 10 mg/dL (ref 8.9–10.3)
Chloride: 101 mmol/L (ref 98–111)
Creatinine: 0.99 mg/dL (ref 0.44–1.00)
GFR, Estimated: >60 mL/min (ref >60)
Glucose, Bld: 94 mg/dL (ref 70–99)
Potassium: 3.8 mmol/L (ref 3.5–5.1)
Sodium: 137 mmol/L (ref 135–145)
Total Bilirubin: 0.5 mg/dL (ref 0.3–1.2)
Total Protein: 7.9 g/dL (ref 6.5–8.1)

## 2022-02-17 LAB — LACTATE DEHYDROGENASE: LDH: 120 U/L (ref 98–192)

## 2022-02-17 IMAGING — MR MR THORACIC SPINE W/O CM
4 of 6 series · 17 of 48 positions shown · non-contrast
Comparison: MRI of the thoracic and lumbar spine [DATE].

CLINICAL DATA: Compression fracture of thoracic vertebra with
delayed healing, subsequent encounter [96] ([96]-CM). Closed
compression fracture of L1 lumbar vertebra with delayed healing,
subsequent encounter [96] ([96]-CM).

EXAM:
MRI THORACIC AND LUMBAR SPINE WITHOUT CONTRAST
TECHNIQUE: Multiplanar and multiecho pulse sequences of the thoracic and lumbar
spine were obtained without intravenous contrast.

[Series 2: T1 · sagittal · 3.0mm · 0.90mm/px · 3 of 17 slices shown (1 of 2)]
[im 1/17]
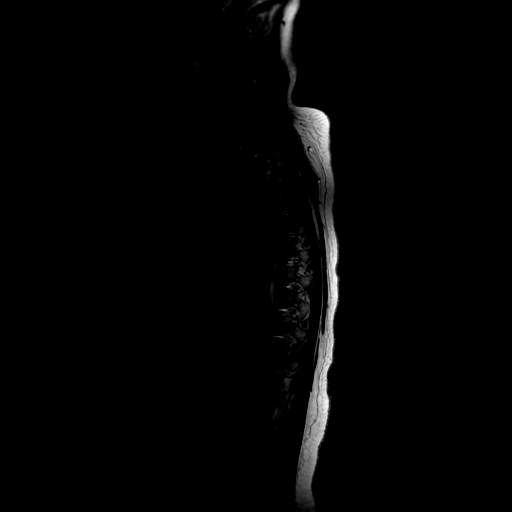
[im 9/17]
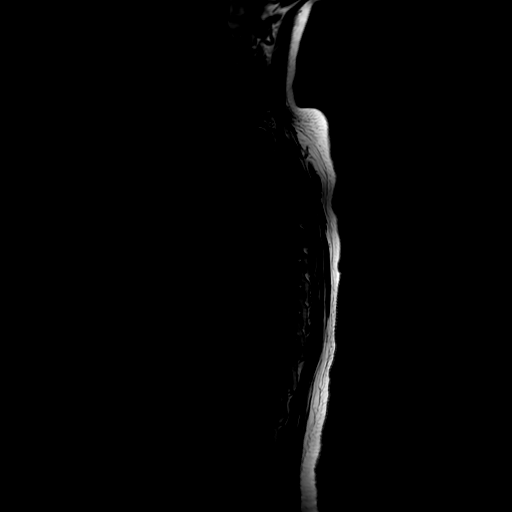
[im 17/17]
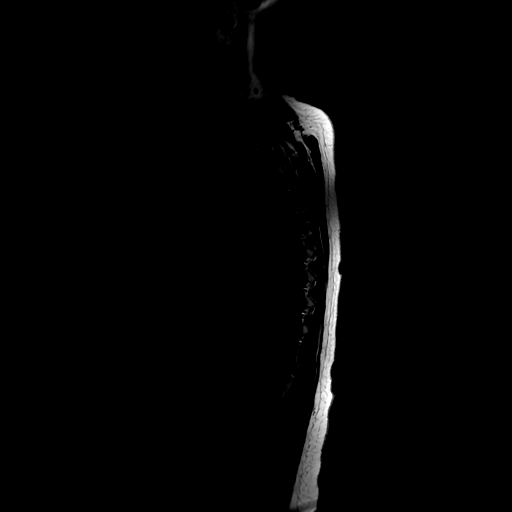

[Series 3: T2 · sagittal · 3.0mm · 0.66mm/px · 5 of 17 slices shown (1 of 2)]
[im 1/17]
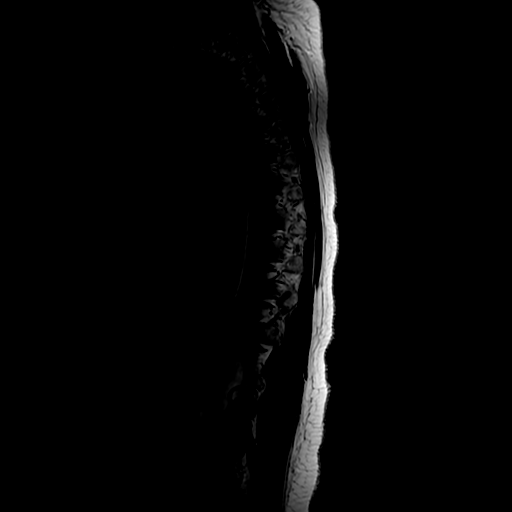
[im 5/17]
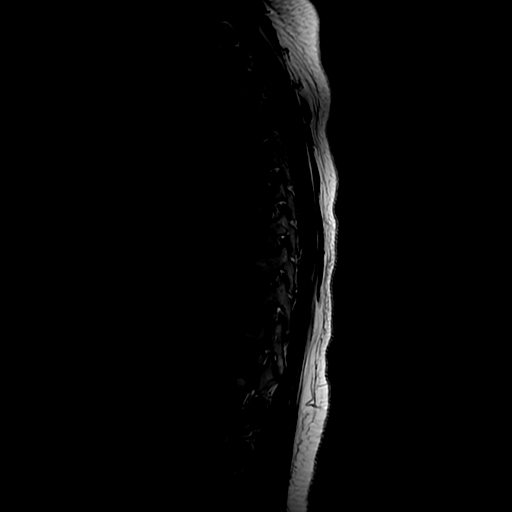
[im 9/17]
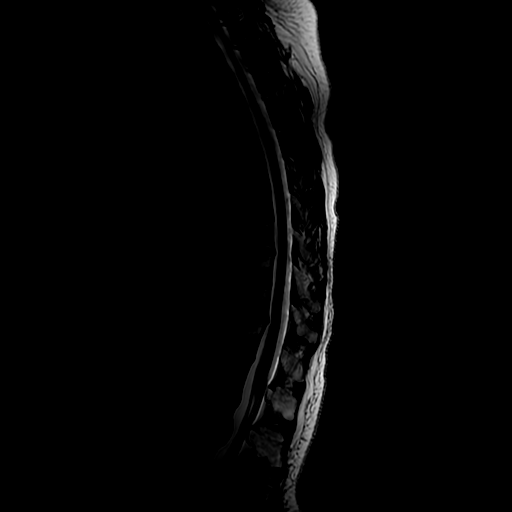
[im 13/17]
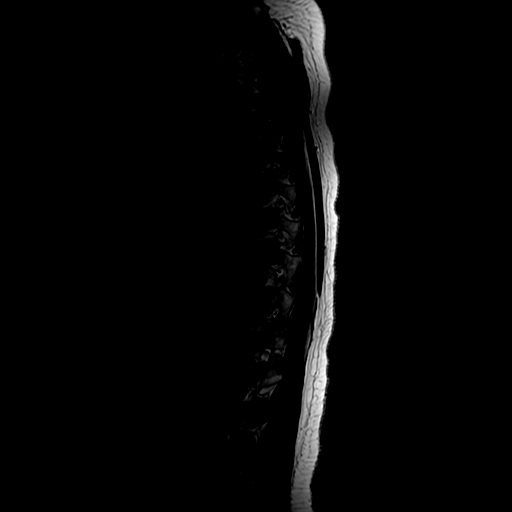
[im 17/17]
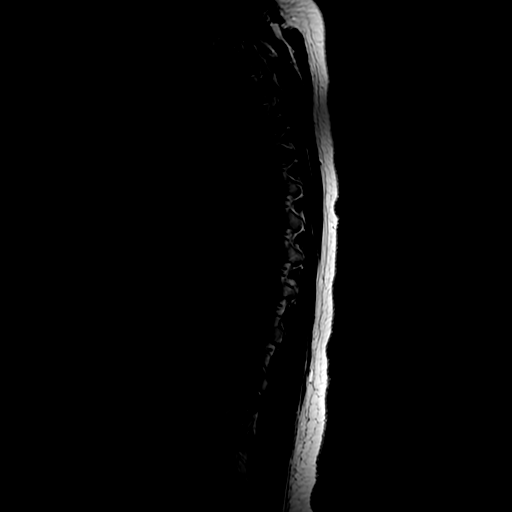

[Series 5: T1 · sagittal · 3.0mm · 0.66mm/px · 3 of 17 slices shown (2 of 2)]
[im 1/17]
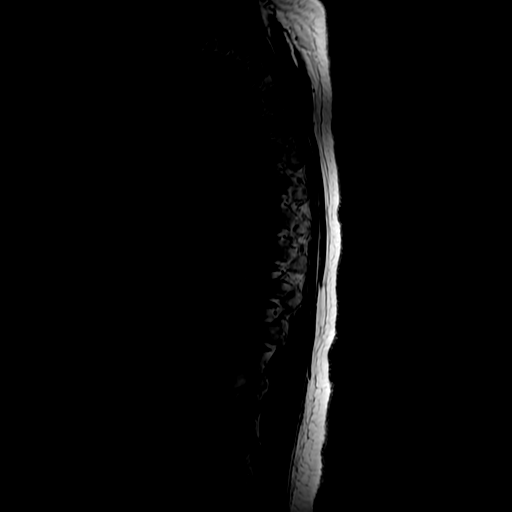
[im 9/17]
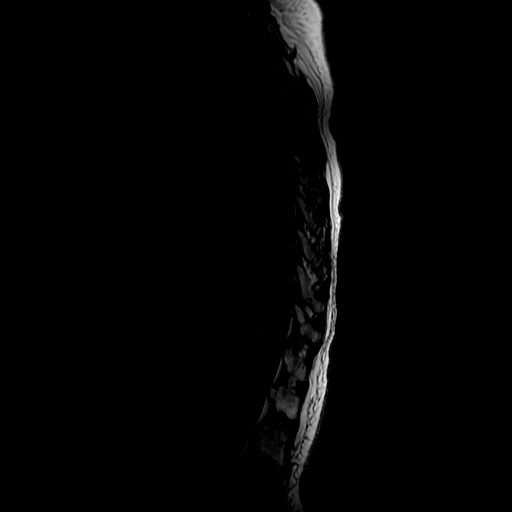
[im 17/17]
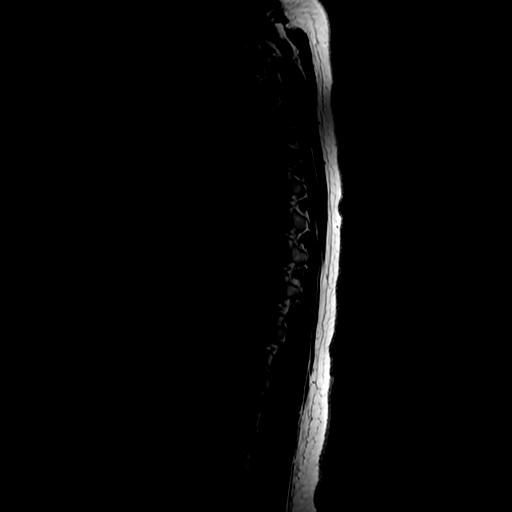

[Series 8: T2 · axial · 4.0mm · 0.39mm/px · z∈[-336,-127]mm · 6 of 46 slices shown (2 of 2)]
[im 1/46]
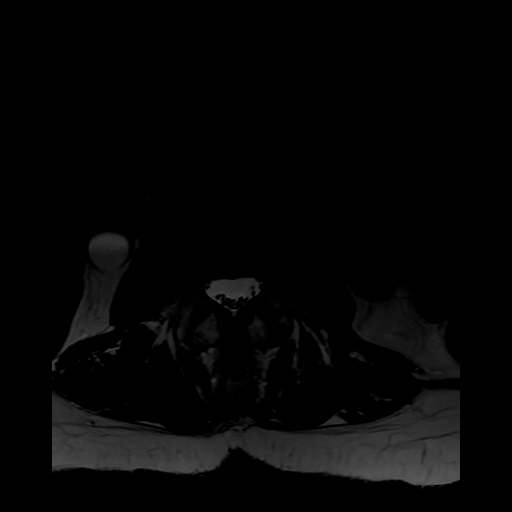
[im 7/46]
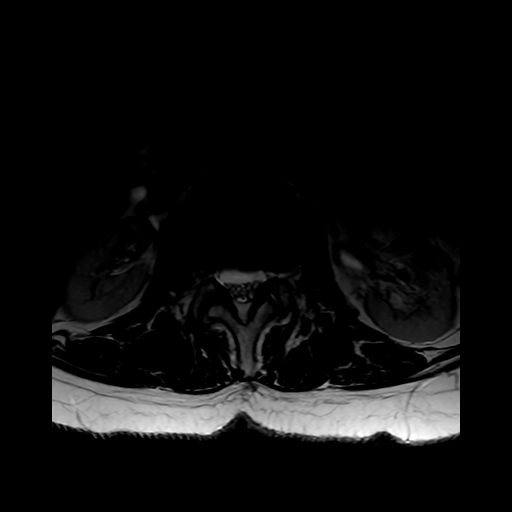
[im 14/46]
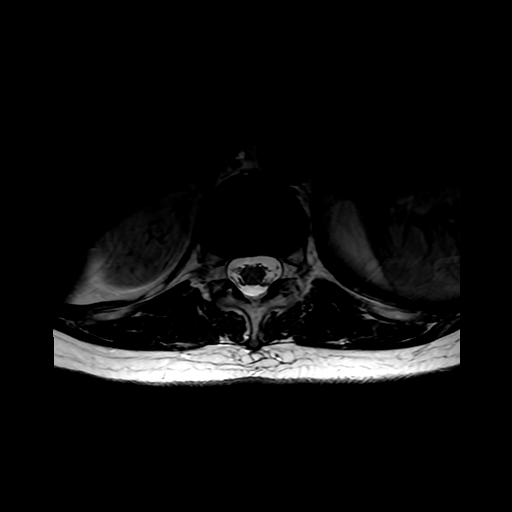
[im 21/46]
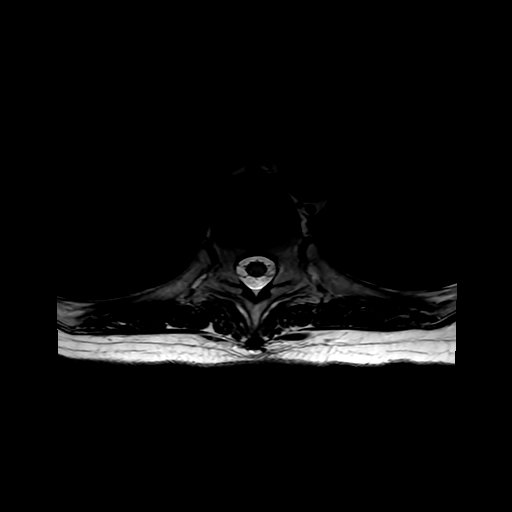
[im 25/46]
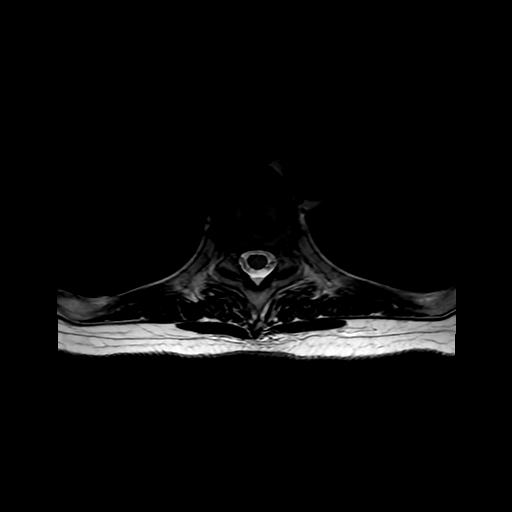
[im 39/46]
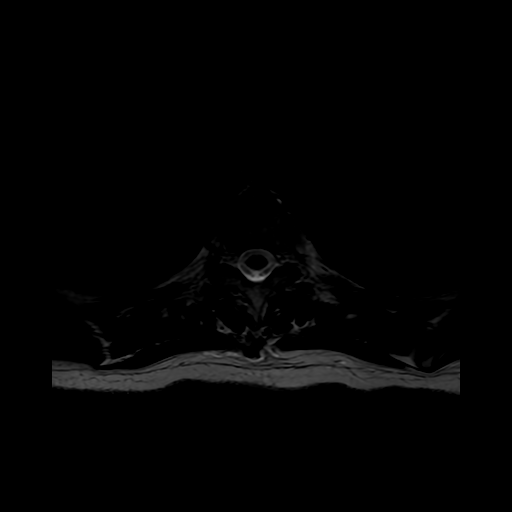

[17 of 48 positions shown; findings below may reference images not displayed]

FINDINGS: MRI THORACIC SPINE FINDINGS

Alignment:  Physiologic.

Vertebrae: Again seen is compression fracture of the T9 vertebral
body resulting in approximately 35% loss of vertebral body height
without retropulsion. There is near resolution of marrow edema with
more mild residual edema in the anterior aspect of the superior
endplate.

Chronic compression fracture of the T11 vertebral body with
approximately 35% vertebral body height loss and associated Schmorl
node. No retropulsion. No new thoracic compression fracture
identified. No evidence of discitis or aggressive bone lesion.

Cord:  Normal signal and morphology.

Paraspinal and other soft tissues: Negative.

Disc levels:

Tiny posterior disc protrusion at T8-9 and T10-11 causing minimal
indentation on the thecal sac no significant spinal canal or neural
foraminal stenosis at any thoracic level.

MRI LUMBAR SPINE FINDINGS

Segmentation:  Chest 1

Alignment: Grade 1 anterolisthesis of L4 over L5 related to facet
arthropathy.

Vertebrae: No acute fracture, evidence of discitis, or bone lesion.
Chronic compression fracture of the L2 vertebral body with
associated Schmorl node. There is approximately 35% vertebral body
height loss. No retropulsion.

Conus medullaris and cauda equina: Conus extends to the T12-L1
level. Conus and cauda equina appear normal.

Paraspinal and other soft tissues: Negative.

Disc levels:

T12-L1: Tiny left central disc protrusion causing minimal
indentation of the thecal sac. No significant spinal canal or neural
foraminal stenosis.

L1-2: Shallow disc bulge with superimposed superiorly migrating left
central disc extrusion causing indentation on the thecal sac without
significant spinal canal stenosis. Mild facet degenerative changes.
Mild bilateral neural foraminal narrowing.

L2-3: Disc bulge with superimposed small right subarticular to
foraminal disc protrusion and mild facet degenerative changes.
Findings result in narrowing of the right subarticular zone and mild
bilateral neural foraminal narrowing, right greater than left.

L3-4: Shallow disc bulge and mild facet degenerative changes without
significant spinal canal or neural foraminal stenosis.

L4-5: Disc bulge/disc uncovering and prominent hypertrophic facet
degenerative changes with ligamentum flavum redundancy resulting in
mild spinal canal stenosis with prominent narrowing of the bilateral
subarticular zones and mild bilateral neural foraminal narrowing.

L5-S1: Right asymmetric disc bulge and moderate facet degenerative
change resulting in mild right neural foraminal narrowing. No spinal
canal stenosis.
IMPRESSION: MR THORACIC SPINE IMPRESSION

1. Late subacute to chronic T9 vertebral compression fracture with
loss of approximately 35% of vertebral body height with near
resolution of the associated marrow edema.
2. Stable appearance of chronic T11 compression fracture.
3. No new vertebral compression fracture identified.

MR LUMBAR SPINE IMPRESSION

1. Stable appearance of chronic L2 vertebral compression fracture.
No new fracture identified.
2. No interval change of the superiorly migrating left central disc
extrusion at L1-2 causing indentation on the thecal sac without
significant spinal canal stenosis.
3. Small right subarticular to foraminal disc protrusion at L2-3
causing narrowing of the right subarticular zone.
4. Advanced degenerative changes at L4-5, predominantly at the facet
joints, resulting in mild spinal canal stenosis with prominent
narrowing of the bilateral subarticular zones.

## 2022-02-17 IMAGING — MR MR LUMBAR SPINE W/O CM
4 of 5 series · 17 of 48 positions shown · non-contrast
Comparison: MRI of the thoracic and lumbar spine [DATE].

CLINICAL DATA: Compression fracture of thoracic vertebra with
delayed healing, subsequent encounter [96] ([96]-CM). Closed
compression fracture of L1 lumbar vertebra with delayed healing,
subsequent encounter [96] ([96]-CM).

EXAM:
MRI THORACIC AND LUMBAR SPINE WITHOUT CONTRAST
TECHNIQUE: Multiplanar and multiecho pulse sequences of the thoracic and lumbar
spine were obtained without intravenous contrast.

[Series 3: T2 · sagittal · 4.0mm · 0.55mm/px · 6 of 15 slices shown (1 of 2)]
[im 1/15]
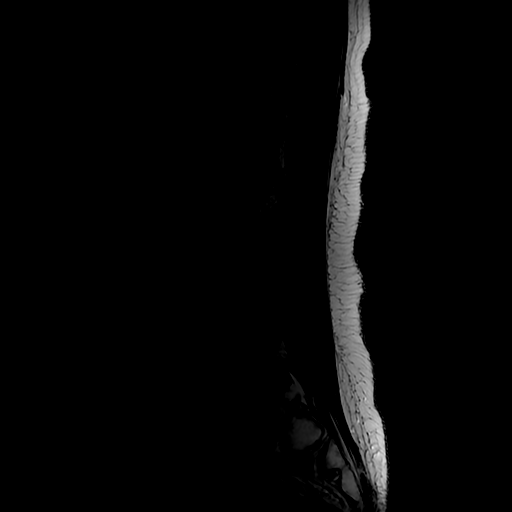
[im 3/15]
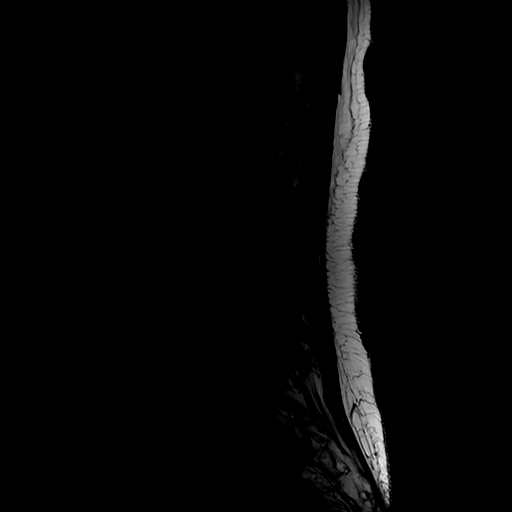
[im 6/15]
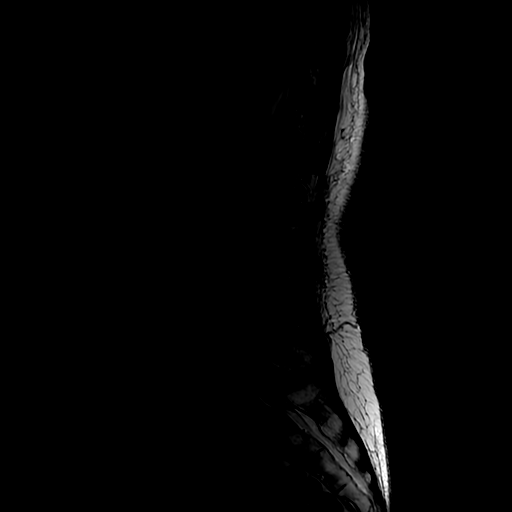
[im 9/15]
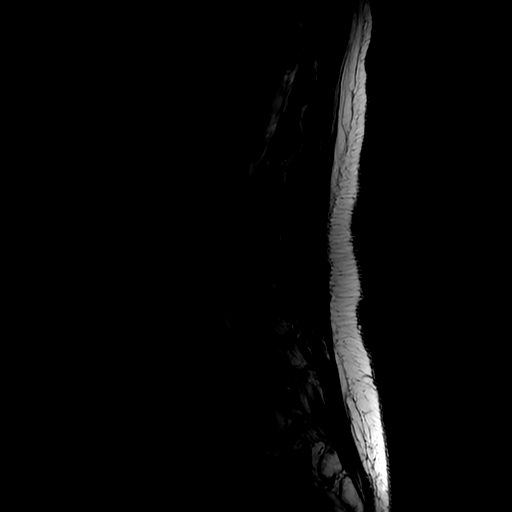
[im 12/15]
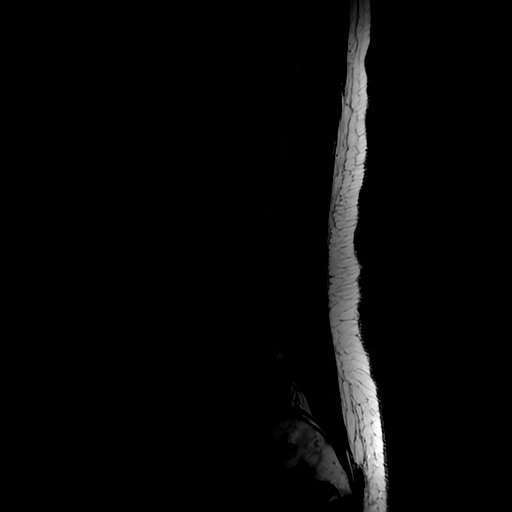
[im 15/15]
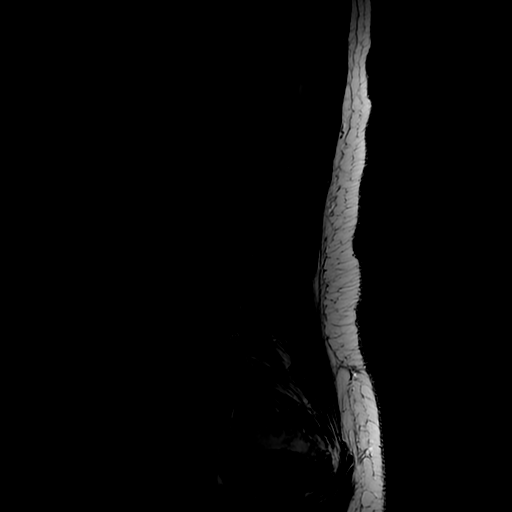

[Series 5: T1 · sagittal · 4.0mm · 0.55mm/px · 3 of 15 slices shown (1 of 2)]
[im 1/15]
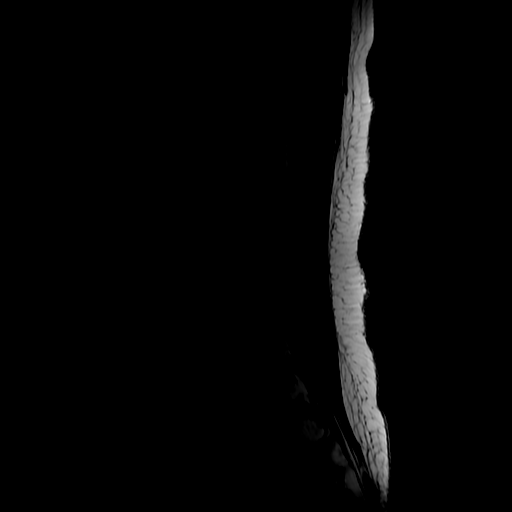
[im 8/15]
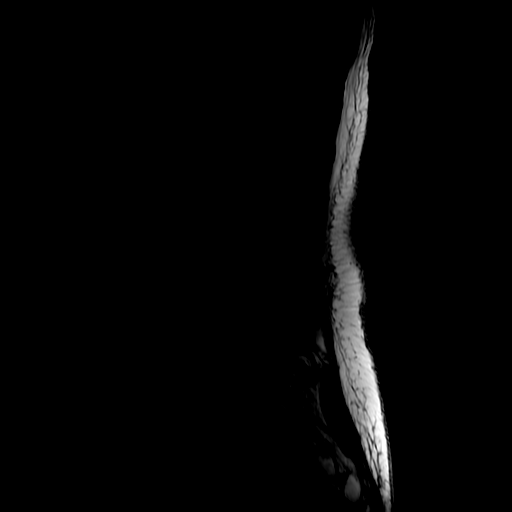
[im 15/15]
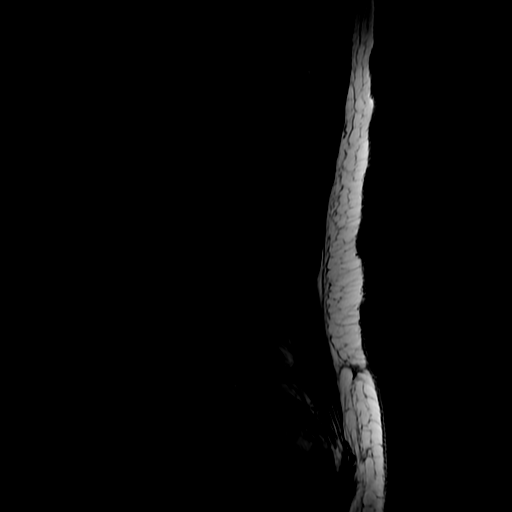

[Series 6: T2 · axial · 4.0mm · 0.39mm/px · z∈[-462,-289]mm · 5 of 43 slices shown (2 of 2)]
[im 3/43]
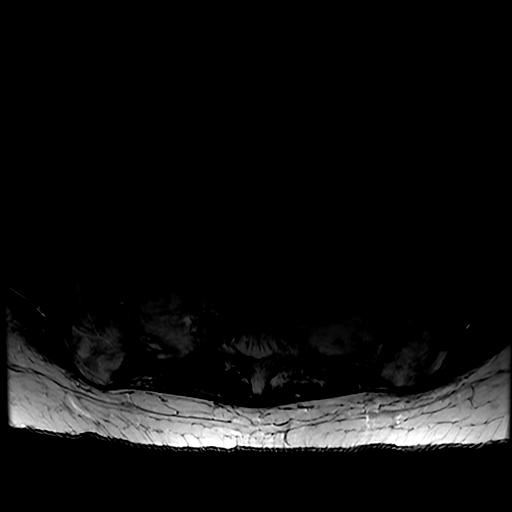
[im 6/43]
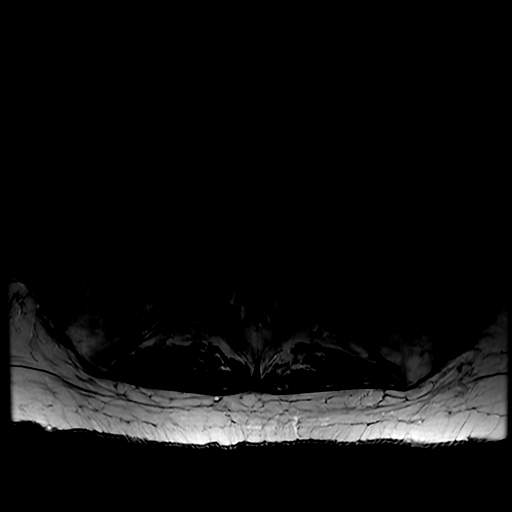
[im 9/43]
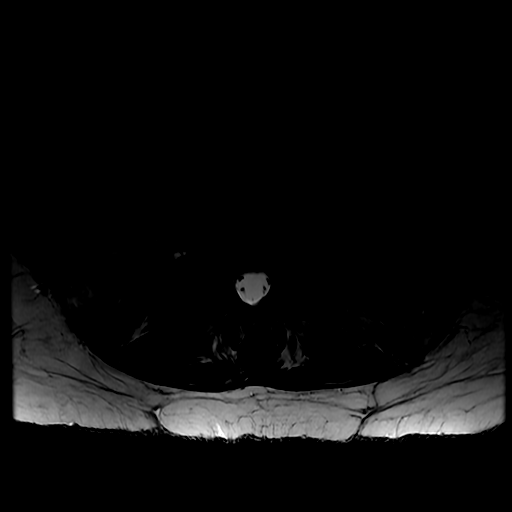
[im 23/43]
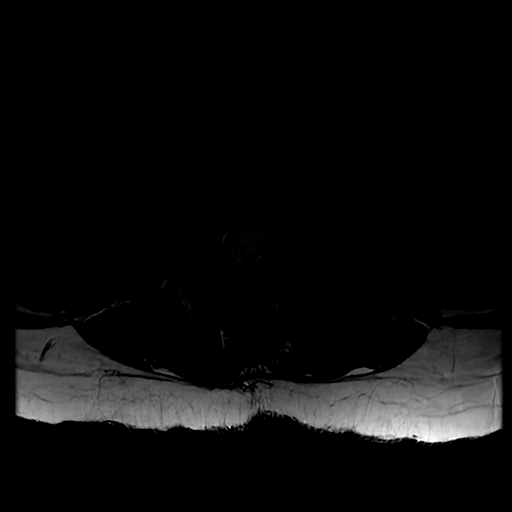
[im 37/43]
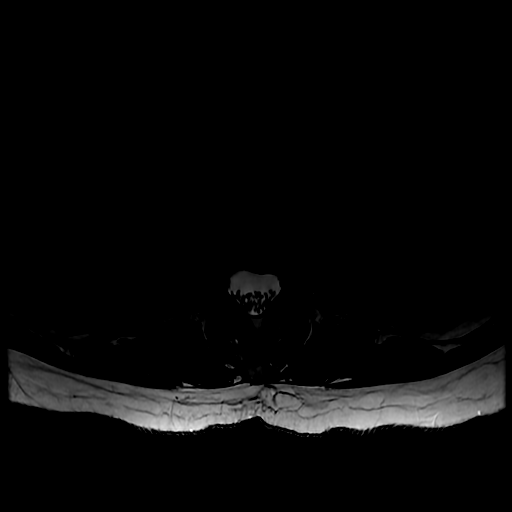

[Series 7: T1 · axial · 4.0mm · 0.39mm/px · z∈[-448,-289]mm · 3 of 43 slices shown (2 of 2)]
[im 6/43]
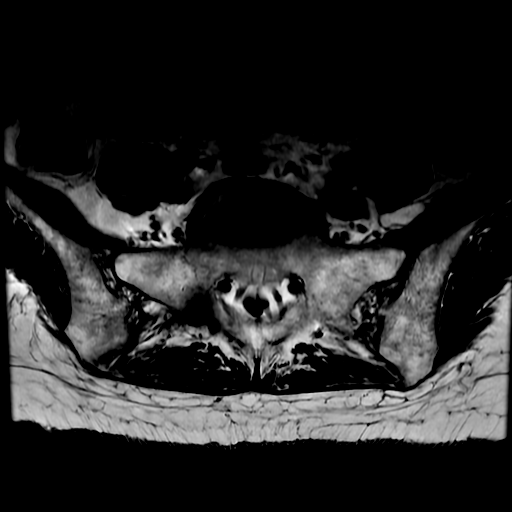
[im 23/43]
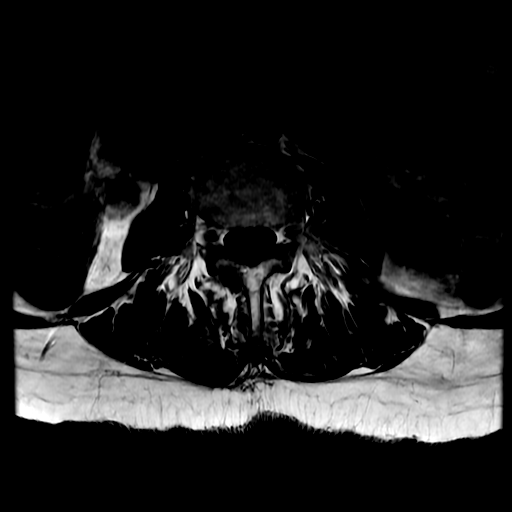
[im 37/43]
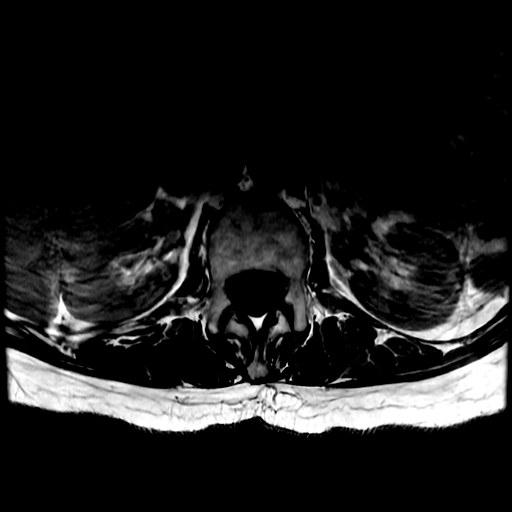

[17 of 48 positions shown; findings below may reference images not displayed]

FINDINGS: MRI THORACIC SPINE FINDINGS

Alignment:  Physiologic.

Vertebrae: Again seen is compression fracture of the T9 vertebral
body resulting in approximately 35% loss of vertebral body height
without retropulsion. There is near resolution of marrow edema with
more mild residual edema in the anterior aspect of the superior
endplate.

Chronic compression fracture of the T11 vertebral body with
approximately 35% vertebral body height loss and associated Schmorl
node. No retropulsion. No new thoracic compression fracture
identified. No evidence of discitis or aggressive bone lesion.

Cord:  Normal signal and morphology.

Paraspinal and other soft tissues: Negative.

Disc levels:

Tiny posterior disc protrusion at T8-9 and T10-11 causing minimal
indentation on the thecal sac no significant spinal canal or neural
foraminal stenosis at any thoracic level.

MRI LUMBAR SPINE FINDINGS

Segmentation:  Chest 1

Alignment: Grade 1 anterolisthesis of L4 over L5 related to facet
arthropathy.

Vertebrae: No acute fracture, evidence of discitis, or bone lesion.
Chronic compression fracture of the L2 vertebral body with
associated Schmorl node. There is approximately 35% vertebral body
height loss. No retropulsion.

Conus medullaris and cauda equina: Conus extends to the T12-L1
level. Conus and cauda equina appear normal.

Paraspinal and other soft tissues: Negative.

Disc levels:

T12-L1: Tiny left central disc protrusion causing minimal
indentation of the thecal sac. No significant spinal canal or neural
foraminal stenosis.

L1-2: Shallow disc bulge with superimposed superiorly migrating left
central disc extrusion causing indentation on the thecal sac without
significant spinal canal stenosis. Mild facet degenerative changes.
Mild bilateral neural foraminal narrowing.

L2-3: Disc bulge with superimposed small right subarticular to
foraminal disc protrusion and mild facet degenerative changes.
Findings result in narrowing of the right subarticular zone and mild
bilateral neural foraminal narrowing, right greater than left.

L3-4: Shallow disc bulge and mild facet degenerative changes without
significant spinal canal or neural foraminal stenosis.

L4-5: Disc bulge/disc uncovering and prominent hypertrophic facet
degenerative changes with ligamentum flavum redundancy resulting in
mild spinal canal stenosis with prominent narrowing of the bilateral
subarticular zones and mild bilateral neural foraminal narrowing.

L5-S1: Right asymmetric disc bulge and moderate facet degenerative
change resulting in mild right neural foraminal narrowing. No spinal
canal stenosis.
IMPRESSION: MR THORACIC SPINE IMPRESSION

1. Late subacute to chronic T9 vertebral compression fracture with
loss of approximately 35% of vertebral body height with near
resolution of the associated marrow edema.
2. Stable appearance of chronic T11 compression fracture.
3. No new vertebral compression fracture identified.

MR LUMBAR SPINE IMPRESSION

1. Stable appearance of chronic L2 vertebral compression fracture.
No new fracture identified.
2. No interval change of the superiorly migrating left central disc
extrusion at L1-2 causing indentation on the thecal sac without
significant spinal canal stenosis.
3. Small right subarticular to foraminal disc protrusion at L2-3
causing narrowing of the right subarticular zone.
4. Advanced degenerative changes at L4-5, predominantly at the facet
joints, resulting in mild spinal canal stenosis with prominent
narrowing of the bilateral subarticular zones.

## 2022-02-17 MED ORDER — OXYCODONE-ACETAMINOPHEN 5-325 MG PO TABS: 1.0000 | ORAL_TABLET | Freq: Four times a day (QID) | ORAL | 0 refills | Status: DC | PRN

## 2022-02-17 NOTE — Progress Notes (Signed)
Hematology oncology outpatient clinic note  DOS: 02/17/2022  Patient Care Team: London Pepper, MD as PCP - General (Family Medicine)  DIAGNOSIS:  Newly diagnosed smoldering myeloma  CHIEF COMPLIANT: Referred for continued evaluation and management of plasma cell dyscrasia.  HISTORY OF PRESENTING ILLNESS: Please see previous note for details on initial presentation  INTERVAL HISTORY:   Kiara Olson is a 66 y.o. female here for continued evaluation and management of newly diagnosed smoldering myeloma. She reports She is doing well with no new symptoms or concerns.  She reports consistent back pain in center of back. She expressed interest in a brace for assistance which we discussed such as the possibility of using a TLSO brace which she was agreeable to. We also discussed using Voltaren ointment and we will refill her Percoset. After bone density, we will consider Zometa or Prolia as needed.  Labs done today were reviewed in detail. CBC counts improved and stable. WBC count is 4.8k, Hgb is 13.2, and platelet count is 228k. CMP unremarkable. MM panel shows M spike of 1.4g/dl LDH at 120. K/L light chain shows ratio of 54.5  ALLERGIES:  is allergic to lidocaine.  MEDICATIONS:  Current Outpatient Medications  Medication Sig Dispense Refill   ARMOUR THYROID 30 MG tablet Take 30 mg by mouth every morning.     Biotin 10000 MCG TABS Take by mouth.     Cyanocobalamin (B-12) 2500 MCG SUBL Place 2,500 mcg under the tongue daily.     fluticasone (FLONASE) 50 MCG/ACT nasal spray Place 1 spray into both nostrils daily as needed for allergies or rhinitis.     LORazepam (ATIVAN) 1 MG tablet Take 1.5 mg by mouth at bedtime.     OVER THE COUNTER MEDICATION Apply 1 application topically daily. Magnesium oil     oxyCODONE-acetaminophen (PERCOCET/ROXICET) 5-325 MG tablet Take 1 tablet by mouth every 6 (six) hours as needed for severe pain. 60 tablet 0   Potassium (POTASSIMIN PO) Place 10  drops under the tongue daily.     Vitamin D-Vitamin K (VITAMIN K2-VITAMIN D3 PO) Place 2.5 drops under the tongue daily.     No current facility-administered medications for this visit.    PHYSICAL EXAMINATION: ECOG PERFORMANCE STATUS: 1 - Symptomatic but completely ambulatory  Vitals:   02/17/22 1140  BP: (!) 121/51  Pulse: 65  Resp: 18  Temp: (!) 97.5 F (36.4 C)  SpO2: 100%    Filed Weights   02/17/22 1140  Weight: 115 lb (52.2 kg)   NAD GENERAL:alert, in no acute distress and comfortable SKIN: no acute rashes, no significant lesions EYES: conjunctiva are pink and non-injected, sclera anicteric NECK: supple, no JVD LYMPH:  no palpable lymphadenopathy in the cervical, axillary or inguinal regions LUNGS: clear to auscultation b/l with normal respiratory effort HEART: regular rate & rhythm ABDOMEN:  normoactive bowel sounds , non tender, not distended. Extremity: no pedal edema PSYCH: alert & oriented x 3 with fluent speech NEURO: no focal motor/sensory deficits  Exam performed in chair.  LABORATORY DATA:  I have reviewed the data as listed .Marland Kitchen    Latest Ref Rng & Units 02/17/2022   11:27 AM 01/20/2022   12:05 PM 12/21/2021    8:47 AM  CBC  WBC 4.0 - 10.5 K/uL 4.8   3.6   3.7    Hemoglobin 12.0 - 15.0 g/dL 13.2   12.8   12.2    Hematocrit 36.0 - 46.0 % 37.5   37.3   34.9  Platelets 150 - 400 K/uL 228   217   198       Lab Results  Component Value Date   WBC 3.6 (L) 01/20/2022   HGB 12.8 01/20/2022   HCT 37.3 01/20/2022   MCV 101.4 (H) 01/20/2022   PLT 217 01/20/2022   NEUTROABS 1.9 01/20/2022   .    Latest Ref Rng & Units 02/17/2022   11:27 AM 01/20/2022   12:05 PM 12/22/2021    2:18 PM  CMP  Glucose 70 - 99 mg/dL 94   95     BUN 8 - 23 mg/dL 13   12     Creatinine 0.44 - 1.00 mg/dL 0.99   0.91   1.00    Sodium 135 - 145 mmol/L 137   140     Potassium 3.5 - 5.1 mmol/L 3.8   4.2     Chloride 98 - 111 mmol/L 101   104     CO2 22 - 32 mmol/L 28   29      Calcium 8.9 - 10.3 mg/dL 10.0   9.9     Total Protein 6.5 - 8.1 g/dL 7.9   7.5     Total Bilirubin 0.3 - 1.2 mg/dL 0.5   0.5     Alkaline Phos 38 - 126 U/L 74   74     AST 15 - 41 U/L 20   24     ALT 0 - 44 U/L 21   31         ASSESSMENT & PLAN:   66 year old female with  #1 newly diagnosed smoldering myeloma  CBC shows normal blood counts with no anemia Previous CMP with normal renal function no hypercalcemia  Bone marrow biopsy 12/21/2021: Mildly hypercellular bone marrow 40 to 50% cellularity with 16% plasma cells kappa light chain restricted (cytogenetics and FISH panel are pending)  Plan -I discussed all the work-up the patient has had thus far including her labs. Labs done today were reviewed in detail. CBC counts improved and stable. WBC count is 4.8k, Hgb is 13.2, and platelet count is 228k. CMP unremarkable. MM panel shows 1.4g/dl LDH at 120. K/L light chain shows ratio of 54 -Had a PET CT scan on 01/14/2022 which showed no evidence of hypermetabolic or lytic myeloma osseous or extraosseous lesions. -We discussed monitoring her myeloma closely with repeat labs. -No indication for treatment of the patient's smoldering myeloma at this time. -Recommended to try a TLSO brace for consistent back pain. -We also discussed using Voltaren ointment as needed.  -Refill her Percoset.  -After bone density, we will consider Zometa or Prolia as needed. -f/u with IR to be evaluated for possible intervention for compression fracture -RTC in 2 months with labs  #2 patient with multilevel compression fractures with back pain -We will refer the patient to IR for consideration of vertebroplasty for pain management.  If vertebroplasty is considered patient could potentially have a biopsy of her fracture area with osteochondral ablation and vertebroplasty/bx -We will get a DEXA scan.  Follow-up RTC with Dr Irene Limbo with labs in 2 months  No orders of the defined types were placed in  this encounter.   The total time spent in the appointment was 30 minutes*.  All of the patient's questions were answered with apparent satisfaction. The patient knows to call the clinic with any problems, questions or concerns.   Sullivan Lone MD MS AAHIVMS [facility] Hematology/Oncology Physician [facility]  .*Total Encounter  Time as defined by the Centers for Medicare and Medicaid Services includes, in addition to the face-to-face time of a patient visit (documented in the note above) non-face-to-face time: obtaining and reviewing outside history, ordering and reviewing medications, tests or procedures, care coordination (communications with other health care professionals or caregivers) and documentation in the medical record.  I, Melene Muller, am acting as scribe for Dr. Sullivan Lone, MD. .I have reviewed the above documentation for accuracy and completeness, and I agree with the above. Brunetta Genera MD

## 2022-02-18 ENCOUNTER — Other Ambulatory Visit: Payer: Self-pay | Admitting: Interventional Radiology

## 2022-02-18 ENCOUNTER — Telehealth: Payer: Self-pay | Admitting: Hematology

## 2022-02-18 DIAGNOSIS — Z712 Person consulting for explanation of examination or test findings: Secondary | ICD-10-CM

## 2022-02-18 LAB — KAPPA/LAMBDA LIGHT CHAINS
Kappa free light chain: 387.5 mg/L — ABNORMAL HIGH (ref 3.3–19.4)
Kappa, lambda light chain ratio: 54.58 — ABNORMAL HIGH (ref 0.26–1.65)
Lambda free light chains: 7.1 mg/L (ref 5.7–26.3)

## 2022-02-18 NOTE — Telephone Encounter (Signed)
Scheduled per 5/24 los, pt has been called and confirmed  

## 2022-02-19 ENCOUNTER — Other Ambulatory Visit: Payer: Medicare HMO

## 2022-02-19 ENCOUNTER — Ambulatory Visit
Admission: RE | Admit: 2022-02-19 | Discharge: 2022-02-19 | Disposition: A | Discharge status: Home / Self Care | Payer: Medicare HMO | Source: Ambulatory Visit | Attending: Interventional Radiology | Admitting: Interventional Radiology

## 2022-02-19 DIAGNOSIS — Z712 Person consulting for explanation of examination or test findings: Secondary | ICD-10-CM

## 2022-02-19 DIAGNOSIS — M5136 Other intervertebral disc degeneration, lumbar region: Secondary | ICD-10-CM | POA: Diagnosis not present

## 2022-02-19 HISTORY — PX: IR RADIOLOGIST EVAL & MGMT: IMG5224

## 2022-02-19 NOTE — Progress Notes (Signed)
Chief Complaint: Patient was consulted remotely today (TeleHealth) for back pain at the request of Gilmer Kaminsky K.    Referring Physician(s): Aneya Daddona K  History of Present Illness: Kiara Olson is a 66 y.o. 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  Her clinical exam finding and the images are reviewed from the outside site did not seem consistent with her current clinical symptoms and so we pursued additional imaging with repeat MRI of the thoracic and lumbar spine.  Fortunately, the MRI demonstrates significant interval healing of the subacute T9 compression fracture which is nearly completely healed.  Additionally, the T11 and L2 compression fractures described are chronic and demonstrate no evidence of persistent edema.  There are no new fractures on the imaging.   Moderate L4-L5 degenerative disc disease is present.  However, her symptoms are more thoracic than they are low lumbar in nature.  She tells me today that she is having some symptoms of fasciculations in her paraspinal muscles where it feels like somebody is massaging her although obviously no one is.  Otherwise, she has no new or acute symptoms.   Past Medical History:  Diagnosis Date   Alpha-1-antitrypsin deficiency ([place])    Moderate level   Aortic atherosclerosis ([place]) 2018   Diverticulitis 2018   GERD (gastroesophageal reflux disease)    Sleepers reflux   Hypothyroidism    Insomnia    Macrocytic anemia    Migraine    PONV (postoperative nausea and vomiting)    Seasonal allergies     Past Surgical History:  Procedure Laterality Date   ABDOMINAL HYSTERECTOMY  1980   ABDOMINOPLASTY  1989   BREAST ENHANCEMENT SURGERY     COLONOSCOPY     X3   CYSTOSCOPY W/ URETERAL STENT PLACEMENT Bilateral 12/08/2018   Procedure: CYSTOSCOPY WITH BILATERAL RETROGRADE PYELOGRAM/BILATERAL  URETERAL STENT PLACEMENT;  Surgeon: Alexis Frock, MD;  Location: [facility];  Service: Urology;  Laterality: Bilateral;   FLEXIBLE SIGMOIDOSCOPY N/A 12/08/2018   Procedure: FLEXIBLE SIGMOIDOSCOPY;  Surgeon: Ileana Roup, MD;  Location: [facility];  Service: General;  Laterality: N/A;   IR RADIOLOGIST EVAL & MGMT  02/12/2022    Allergies: Lidocaine  Medications: Prior to Admission medications   Medication Sig Start Date End Date Taking? Authorizing Provider  ARMOUR THYROID 30 MG tablet Take 30 mg by mouth every morning. 10/27/18   [provider]  LORazepam (ATIVAN) 1 MG tablet Take 1.5 mg by mouth at bedtime. 10/15/18   [provider]  methocarbamol (ROBAXIN) 500 MG tablet Take 1,000 mg by mouth every 6 (six) hours. 02/12/22   [provider]  OVER THE COUNTER MEDICATION Apply 1 application topically daily. Magnesium oil    [provider]  oxyCODONE-acetaminophen (PERCOCET/ROXICET) 5-325 MG tablet Take 1 tablet by mouth every 6 (six) hours as needed for severe pain. For compression fracture in spine 02/17/22   Brunetta Genera, MD  Vitamin D-Vitamin K (VITAMIN K2-VITAMIN D3 PO) Place 2.5 drops under the tongue daily.    [provider]     No family history on file.  Social History   Socioeconomic History   Marital status: Widowed    Spouse name: Not on file   Number of children: Not on file   Years of education: Not on file   Highest education level: Not on file  Occupational History   Not on file  Tobacco Use  Smoking status: Former    Packs/day: 1.00    Years: 10.00    Pack years: 10.00    Types: Cigarettes   Smokeless tobacco: Never  Vaping Use   Vaping Use: Never used  Substance and Sexual Activity   Alcohol use: Yes    Comment: rare   Drug use: Never   Sexual activity: Not on file  Other Topics Concern   Not on file  Social History Narrative   Not on file   Social Determinants of Health   Financial Resource  Strain: Not on file  Food Insecurity: Not on file  Transportation Needs: Not on file  Physical Activity: Not on file  Stress: Not on file  Social Connections: Not on file    Review of Systems  Review of Systems: A 12 point ROS discussed and pertinent positives are indicated in the HPI above.  All other systems are negative.  Physical Exam No direct physical exam was performed (except for noted visual exam findings with Video Visits).    Vital Signs: There were no vitals taken for this visit.  Imaging: MR THORACIC SPINE WO CONTRAST  Result Date: 02/18/2022 CLINICAL DATA:  Compression fracture of thoracic vertebra with delayed healing, subsequent encounter S22.000G (ICD-10-CM). Closed compression fracture of L1 lumbar vertebra with delayed healing, subsequent encounter S32.010G (ICD-10-CM). EXAM: MRI THORACIC AND LUMBAR SPINE WITHOUT CONTRAST TECHNIQUE: Multiplanar and multiecho pulse sequences of the thoracic and lumbar spine were obtained without intravenous contrast. COMPARISON:  MRI of the thoracic and lumbar spine November 26, 2021. FINDINGS: MRI THORACIC SPINE FINDINGS Alignment:  Physiologic. Vertebrae: Again seen is compression fracture of the T9 vertebral body resulting in approximately 35% loss of vertebral body height without retropulsion. There is near resolution of marrow edema with more mild residual edema in the anterior aspect of the superior endplate. Chronic compression fracture of the T11 vertebral body with approximately 35% vertebral body height loss and associated Schmorl node. No retropulsion. No new thoracic compression fracture identified. No evidence of discitis or aggressive bone lesion. Cord:  Normal signal and morphology. Paraspinal and other soft tissues: Negative. Disc levels: Tiny posterior disc protrusion at T8-9 and T10-11 causing minimal indentation on the thecal sac no significant spinal canal or neural foraminal stenosis at any thoracic level. MRI LUMBAR SPINE  FINDINGS Segmentation:  Chest 1 Alignment: Grade 1 anterolisthesis of L4 over L5 related to facet arthropathy. Vertebrae: No acute fracture, evidence of discitis, or bone lesion. Chronic compression fracture of the L2 vertebral body with associated Schmorl node. There is approximately 35% vertebral body height loss. No retropulsion. Conus medullaris and cauda equina: Conus extends to the T12-L1 level. Conus and cauda equina appear normal. Paraspinal and other soft tissues: Negative. Disc levels: T12-L1: Tiny left central disc protrusion causing minimal indentation of the thecal sac. No significant spinal canal or neural foraminal stenosis. L1-2: Shallow disc bulge with superimposed superiorly migrating left central disc extrusion causing indentation on the thecal sac without significant spinal canal stenosis. Mild facet degenerative changes. Mild bilateral neural foraminal narrowing. L2-3: Disc bulge with superimposed small right subarticular to foraminal disc protrusion and mild facet degenerative changes. Findings result in narrowing of the right subarticular zone and mild bilateral neural foraminal narrowing, right greater than left. L3-4: Shallow disc bulge and mild facet degenerative changes without significant spinal canal or neural foraminal stenosis. L4-5: Disc bulge/disc uncovering and prominent hypertrophic facet degenerative changes with ligamentum flavum redundancy resulting in mild spinal canal stenosis with prominent narrowing of the  bilateral subarticular zones and mild bilateral neural foraminal narrowing. L5-S1: Right asymmetric disc bulge and moderate facet degenerative change resulting in mild right neural foraminal narrowing. No spinal canal stenosis. IMPRESSION: MR THORACIC SPINE IMPRESSION 1. Late subacute to chronic T9 vertebral compression fracture with loss of approximately 35% of vertebral body height with near resolution of the associated marrow edema. 2. Stable appearance of chronic T11  compression fracture. 3. No new vertebral compression fracture identified. MR LUMBAR SPINE IMPRESSION 1. Stable appearance of chronic L2 vertebral compression fracture. No new fracture identified. 2. No interval change of the superiorly migrating left central disc extrusion at L1-2 causing indentation on the thecal sac without significant spinal canal stenosis. 3. Small right subarticular to foraminal disc protrusion at L2-3 causing narrowing of the right subarticular zone. 4. Advanced degenerative changes at L4-5, predominantly at the facet joints, resulting in mild spinal canal stenosis with prominent narrowing of the bilateral subarticular zones. Electronically Signed   By: Pedro Earls M.D.   On: 02/18/2022 13:47   MR LUMBAR SPINE WO CONTRAST  Result Date: 02/18/2022 CLINICAL DATA:  Compression fracture of thoracic vertebra with delayed healing, subsequent encounter S22.000G (ICD-10-CM). Closed compression fracture of L1 lumbar vertebra with delayed healing, subsequent encounter S32.010G (ICD-10-CM). EXAM: MRI THORACIC AND LUMBAR SPINE WITHOUT CONTRAST TECHNIQUE: Multiplanar and multiecho pulse sequences of the thoracic and lumbar spine were obtained without intravenous contrast. COMPARISON:  MRI of the thoracic and lumbar spine November 26, 2021. FINDINGS: MRI THORACIC SPINE FINDINGS Alignment:  Physiologic. Vertebrae: Again seen is compression fracture of the T9 vertebral body resulting in approximately 35% loss of vertebral body height without retropulsion. There is near resolution of marrow edema with more mild residual edema in the anterior aspect of the superior endplate. Chronic compression fracture of the T11 vertebral body with approximately 35% vertebral body height loss and associated Schmorl node. No retropulsion. No new thoracic compression fracture identified. No evidence of discitis or aggressive bone lesion. Cord:  Normal signal and morphology. Paraspinal and other soft tissues:  Negative. Disc levels: Tiny posterior disc protrusion at T8-9 and T10-11 causing minimal indentation on the thecal sac no significant spinal canal or neural foraminal stenosis at any thoracic level. MRI LUMBAR SPINE FINDINGS Segmentation:  Chest 1 Alignment: Grade 1 anterolisthesis of L4 over L5 related to facet arthropathy. Vertebrae: No acute fracture, evidence of discitis, or bone lesion. Chronic compression fracture of the L2 vertebral body with associated Schmorl node. There is approximately 35% vertebral body height loss. No retropulsion. Conus medullaris and cauda equina: Conus extends to the T12-L1 level. Conus and cauda equina appear normal. Paraspinal and other soft tissues: Negative. Disc levels: T12-L1: Tiny left central disc protrusion causing minimal indentation of the thecal sac. No significant spinal canal or neural foraminal stenosis. L1-2: Shallow disc bulge with superimposed superiorly migrating left central disc extrusion causing indentation on the thecal sac without significant spinal canal stenosis. Mild facet degenerative changes. Mild bilateral neural foraminal narrowing. L2-3: Disc bulge with superimposed small right subarticular to foraminal disc protrusion and mild facet degenerative changes. Findings result in narrowing of the right subarticular zone and mild bilateral neural foraminal narrowing, right greater than left. L3-4: Shallow disc bulge and mild facet degenerative changes without significant spinal canal or neural foraminal stenosis. L4-5: Disc bulge/disc uncovering and prominent hypertrophic facet degenerative changes with ligamentum flavum redundancy resulting in mild spinal canal stenosis with prominent narrowing of the bilateral subarticular zones and mild bilateral neural foraminal narrowing. L5-S1: Right  asymmetric disc bulge and moderate facet degenerative change resulting in mild right neural foraminal narrowing. No spinal canal stenosis. IMPRESSION: MR THORACIC SPINE  IMPRESSION 1. Late subacute to chronic T9 vertebral compression fracture with loss of approximately 35% of vertebral body height with near resolution of the associated marrow edema. 2. Stable appearance of chronic T11 compression fracture. 3. No new vertebral compression fracture identified. MR LUMBAR SPINE IMPRESSION 1. Stable appearance of chronic L2 vertebral compression fracture. No new fracture identified. 2. No interval change of the superiorly migrating left central disc extrusion at L1-2 causing indentation on the thecal sac without significant spinal canal stenosis. 3. Small right subarticular to foraminal disc protrusion at L2-3 causing narrowing of the right subarticular zone. 4. Advanced degenerative changes at L4-5, predominantly at the facet joints, resulting in mild spinal canal stenosis with prominent narrowing of the bilateral subarticular zones. Electronically Signed   By: Pedro Earls M.D.   On: 02/18/2022 13:47   IR Radiologist Eval & Mgmt  Result Date: 02/12/2022 Please refer to notes tab for details about interventional procedure. (Op Note)   Labs:  CBC: Recent Labs    12/18/21 1400 12/21/21 0847 01/20/22 1205 02/17/22 1127  WBC 4.6 3.7* 3.6* 4.8  HGB 12.7 12.2 12.8 13.2  HCT 37.1 34.9* 37.3 37.5  PLT 218 198 217 228    COAGS: No results for input(s): INR, APTT in the last 8760 hours.  BMP: Recent Labs    12/22/21 1418 01/20/22 1205 02/17/22 1127  NA  --  140 137  K  --  4.2 3.8  CL  --  104 101  CO2  --  29 28  GLUCOSE  --  95 94  BUN  --  12 13  CALCIUM  --  9.9 10.0  CREATININE 1.00 0.91 0.99  GFRNONAA  --  >60 >60    LIVER FUNCTION TESTS: Recent Labs    01/20/22 1205 02/17/22 1127  BILITOT 0.5 0.5  AST 24 20  ALT 31 21  ALKPHOS 74 74  PROT 7.5 7.9  ALBUMIN 4.5 4.7    TUMOR MARKERS: No results for input(s): AFPTM, CEA, CA199, CHROMGRNA in the last 8760 hours.  Assessment and Plan:  Fortunately, miss [place] 6  repeat MRI demonstrates significant interval healing of the T9 compression fracture.  She has trace residual edema at the anterior superior endplate.  Additionally, the previously described T11 and L2 fractures are chronic and completely healed.  She has mild degenerative disc disease as well with more advanced changes at L4-L5, but this does not correspond with her site of primary symptoms.    We will not proceed with cement augmentation as there are no active/un healed fractures.  Her pain appears to be musculoskeletal in nature.    1.)  We will refer for physical therapy: PT to evaluate and manage lumbosacral musculoskeletal pain. 2.)  no further interventional radiology follow-up.  Further follow-up with PCP.    Electronically Signed: Criselda Peaches 02/19/2022, 11:17 AM   I spent a total of 10 Minutes in remote  clinical consultation, greater than 50% of which was counseling/coordinating care for back pain.    Visit type: Audio only (telephone). Audio (no video) only due to patient preference. Alternative for in-person consultation at [facility], [address], [place], [place]. This visit type was conducted due to national recommendations for restrictions regarding the COVID-19 Pandemic (e.g. social distancing).  This format is felt to be most appropriate for this  patient at this time.  All issues noted in this document were discussed and addressed.

## 2022-02-23 LAB — MULTIPLE MYELOMA PANEL, SERUM
Albumin SerPl Elph-Mcnc: 4.6 g/dL — ABNORMAL HIGH (ref 2.9–4.4)
Albumin/Glob SerPl: 1.6 (ref 0.7–1.7)
Alpha 1: 0.1 g/dL (ref 0.0–0.4)
Alpha2 Glob SerPl Elph-Mcnc: 0.7 g/dL (ref 0.4–1.0)
B-Globulin SerPl Elph-Mcnc: 1.8 g/dL — ABNORMAL HIGH (ref 0.7–1.3)
Gamma Glob SerPl Elph-Mcnc: 0.2 g/dL — ABNORMAL LOW (ref 0.4–1.8)
Globulin, Total: 2.9 g/dL (ref 2.2–3.9)
IgA: 38 mg/dL — ABNORMAL LOW (ref 87–352)
IgG (Immunoglobin G), Serum: 1121 mg/dL (ref 586–1602)
IgM (Immunoglobulin M), Srm: 25 mg/dL — ABNORMAL LOW (ref 26–217)
M Protein SerPl Elph-Mcnc: 1.4 g/dL — ABNORMAL HIGH
Total Protein ELP: 7.5 g/dL (ref 6.0–8.5)

## 2022-03-01 ENCOUNTER — Other Ambulatory Visit: Payer: Self-pay | Admitting: Orthopedic Surgery

## 2022-03-01 DIAGNOSIS — M810 Age-related osteoporosis without current pathological fracture: Secondary | ICD-10-CM

## 2022-03-02 ENCOUNTER — Ambulatory Visit
Admission: RE | Admit: 2022-03-02 | Discharge: 2022-03-02 | Disposition: A | Discharge status: Home / Self Care | Payer: Medicare HMO | Source: Ambulatory Visit | Attending: Orthopedic Surgery | Admitting: Orthopedic Surgery

## 2022-03-02 DIAGNOSIS — M810 Age-related osteoporosis without current pathological fracture: Secondary | ICD-10-CM | POA: Diagnosis not present

## 2022-03-02 DIAGNOSIS — M85832 Other specified disorders of bone density and structure, left forearm: Secondary | ICD-10-CM | POA: Diagnosis not present

## 2022-03-02 DIAGNOSIS — Z780 Asymptomatic menopausal state: Secondary | ICD-10-CM | POA: Diagnosis not present

## 2022-03-04 ENCOUNTER — Encounter: Payer: Self-pay | Admitting: Hematology

## 2022-03-10 DIAGNOSIS — H5203 Hypermetropia, bilateral: Secondary | ICD-10-CM | POA: Diagnosis not present

## 2022-03-10 DIAGNOSIS — M545 Low back pain, unspecified: Secondary | ICD-10-CM | POA: Diagnosis not present

## 2022-03-10 DIAGNOSIS — M4854XA Collapsed vertebra, not elsewhere classified, thoracic region, initial encounter for fracture: Secondary | ICD-10-CM | POA: Diagnosis not present

## 2022-04-14 DIAGNOSIS — M549 Dorsalgia, unspecified: Secondary | ICD-10-CM | POA: Diagnosis not present

## 2022-04-14 DIAGNOSIS — G4700 Insomnia, unspecified: Secondary | ICD-10-CM | POA: Diagnosis not present

## 2022-04-14 DIAGNOSIS — Z1231 Encounter for screening mammogram for malignant neoplasm of breast: Secondary | ICD-10-CM | POA: Diagnosis not present

## 2022-04-15 ENCOUNTER — Other Ambulatory Visit: Payer: Self-pay | Admitting: Family Medicine

## 2022-04-15 ENCOUNTER — Other Ambulatory Visit: Payer: Self-pay

## 2022-04-15 DIAGNOSIS — Z1231 Encounter for screening mammogram for malignant neoplasm of breast: Secondary | ICD-10-CM

## 2022-04-15 DIAGNOSIS — D472 Monoclonal gammopathy: Secondary | ICD-10-CM

## 2022-04-19 ENCOUNTER — Inpatient Hospital Stay (HOSPITAL_BASED_OUTPATIENT_CLINIC_OR_DEPARTMENT_OTHER): Payer: Medicare HMO | Admitting: Hematology

## 2022-04-19 ENCOUNTER — Other Ambulatory Visit: Payer: Self-pay

## 2022-04-19 ENCOUNTER — Inpatient Hospital Stay: Payer: Medicare HMO | Attending: Hematology and Oncology

## 2022-04-19 VITALS — BP 144/68 | HR 77 | Temp 97.9°F | Resp 20 | Wt 110.6 lb

## 2022-04-19 DIAGNOSIS — C9000 Multiple myeloma not having achieved remission: Secondary | ICD-10-CM | POA: Diagnosis not present

## 2022-04-19 DIAGNOSIS — D472 Monoclonal gammopathy: Secondary | ICD-10-CM

## 2022-04-19 DIAGNOSIS — Z79899 Other long term (current) drug therapy: Secondary | ICD-10-CM | POA: Insufficient documentation

## 2022-04-19 LAB — CBC WITH DIFFERENTIAL (CANCER CENTER ONLY)
Abs Immature Granulocytes: 0.02 10*3/uL (ref 0.00–0.07)
Basophils Absolute: 0 10*3/uL (ref 0.0–0.1)
Basophils Relative: 0 %
Eosinophils Absolute: 0 10*3/uL (ref 0.0–0.5)
Eosinophils Relative: 0 %
HCT: 35.8 % — ABNORMAL LOW (ref 36.0–46.0)
Hemoglobin: 12.8 g/dL (ref 12.0–15.0)
Immature Granulocytes: 0 %
Lymphocytes Relative: 15 %
Lymphs Abs: 0.9 10*3/uL (ref 0.7–4.0)
MCH: 36.4 pg — ABNORMAL HIGH (ref 26.0–34.0)
MCHC: 35.8 g/dL (ref 30.0–36.0)
MCV: 101.7 fL — ABNORMAL HIGH (ref 80.0–100.0)
Monocytes Absolute: 0.3 10*3/uL (ref 0.1–1.0)
Monocytes Relative: 5 %
Neutro Abs: 4.4 10*3/uL (ref 1.7–7.7)
Neutrophils Relative %: 80 %
Platelet Count: 213 10*3/uL (ref 150–400)
RBC: 3.52 MIL/uL — ABNORMAL LOW (ref 3.87–5.11)
RDW: 12.2 % (ref 11.5–15.5)
WBC Count: 5.6 10*3/uL (ref 4.0–10.5)
nRBC: 0 % (ref 0.0–0.2)

## 2022-04-19 LAB — CMP (CANCER CENTER ONLY)
ALT: 14 U/L (ref 0–44)
AST: 18 U/L (ref 15–41)
Albumin: 4.6 g/dL (ref 3.5–5.0)
Alkaline Phosphatase: 69 U/L (ref 38–126)
Anion gap: 12 (ref 5–15)
BUN: 19 mg/dL (ref 8–23)
CO2: 23 mmol/L (ref 22–32)
Calcium: 9.9 mg/dL (ref 8.9–10.3)
Chloride: 101 mmol/L (ref 98–111)
Creatinine: 0.91 mg/dL (ref 0.44–1.00)
GFR, Estimated: >60 mL/min (ref >60)
Glucose, Bld: 74 mg/dL (ref 70–99)
Potassium: 4.2 mmol/L (ref 3.5–5.1)
Sodium: 136 mmol/L (ref 135–145)
Total Bilirubin: 0.5 mg/dL (ref 0.3–1.2)
Total Protein: 7.5 g/dL (ref 6.5–8.1)

## 2022-04-19 LAB — LACTATE DEHYDROGENASE: LDH: 121 U/L (ref 98–192)

## 2022-04-19 NOTE — Progress Notes (Signed)
Hematology oncology outpatient clinic note  DOS: 04/19/2022  Patient Care Team: London Pepper, MD as PCP - General (Family Medicine)  DIAGNOSIS:  Recently diagnosed smoldering myeloma  CHIEF COMPLIANT: Referred for continued evaluation and management of plasma cell dyscrasia.  HISTORY OF PRESENTING ILLNESS: Please see previous note for details on initial presentation  INTERVAL HISTORY:   Kiara Olson is a 66 y.o. female here for continued evaluation and management of newly diagnosed smoldering myeloma. She reports She is doing well with no new symptoms or concerns.  We discussed bone density done 03/02/2022. The findings are consistent with osteoporosis. We discussed trying to do low impact exercise.   We discussed Zometa/Prolea for treatment which she has decided to hold off and provide a decision later. We further discussed a possible referral to spine surgeon which she has also decided to provide a decision later. She noted that she would like to pursue dry needling and will provide her decisions after.  Labs done today were reviewed in detail.  ALLERGIES:  is allergic to lidocaine.  MEDICATIONS:  Current Outpatient Medications  Medication Sig Dispense Refill   ARMOUR THYROID 30 MG tablet Take 30 mg by mouth every morning.     LORazepam (ATIVAN) 1 MG tablet Take 1.5 mg by mouth at bedtime.     methocarbamol (ROBAXIN) 500 MG tablet Take 1,000 mg by mouth every 6 (six) hours.     OVER THE COUNTER MEDICATION Apply 1 application topically daily. Magnesium oil     oxyCODONE-acetaminophen (PERCOCET/ROXICET) 5-325 MG tablet Take 1 tablet by mouth every 6 (six) hours as needed for severe pain. For compression fracture in spine 60 tablet 0   Vitamin D-Vitamin K (VITAMIN K2-VITAMIN D3 PO) Place 2.5 drops under the tongue daily.     No current facility-administered medications for this visit.    PHYSICAL EXAMINATION: ECOG PERFORMANCE STATUS: 1 - Symptomatic but completely  ambulatory  Vitals:   04/19/22 1312  BP: (!) 144/68  Pulse: 77  Resp: 20  Temp: 97.9 F (36.6 C)  SpO2: 100%    Filed Weights   04/19/22 1312  Weight: 110 lb 9.6 oz (50.2 kg)   NAD GENERAL:alert, in no acute distress and comfortable SKIN: no acute rashes, no significant lesions EYES: conjunctiva are pink and non-injected, sclera anicteric NECK: supple, no JVD LYMPH:  no palpable lymphadenopathy in the cervical, axillary or inguinal regions LUNGS: clear to auscultation b/l with normal respiratory effort HEART: regular rate & rhythm ABDOMEN:  normoactive bowel sounds , non tender, not distended. Extremity: no pedal edema PSYCH: alert & oriented x 3 with fluent speech NEURO: no focal motor/sensory deficits  LABORATORY DATA:  I have reviewed the data as listed .Marland Kitchen    Latest Ref Rng & Units 04/19/2022   12:55 PM 02/17/2022   11:27 AM 01/20/2022   12:05 PM  CBC  WBC 4.0 - 10.5 K/uL 5.6  4.8  3.6   Hemoglobin 12.0 - 15.0 g/dL 12.8  13.2  12.8   Hematocrit 36.0 - 46.0 % 35.8  37.5  37.3   Platelets 150 - 400 K/uL 213  228  217      Lab Results  Component Value Date   WBC 5.6 04/19/2022   HGB 12.8 04/19/2022   HCT 35.8 (L) 04/19/2022   MCV 101.7 (H) 04/19/2022   PLT 213 04/19/2022   NEUTROABS 4.4 04/19/2022   .    Latest Ref Rng & Units 04/19/2022   12:55 PM 02/17/2022   11:27  AM 01/20/2022   12:05 PM  CMP  Glucose 70 - 99 mg/dL 74  94  95   BUN 8 - 23 mg/dL $Remove'19  13  12   'PaInhUM$ Creatinine 0.44 - 1.00 mg/dL 0.91  0.99  0.91   Sodium 135 - 145 mmol/L 136  137  140   Potassium 3.5 - 5.1 mmol/L 4.2  3.8  4.2   Chloride 98 - 111 mmol/L 101  101  104   CO2 22 - 32 mmol/L $RemoveB'23  28  29   'ntRRFMyU$ Calcium 8.9 - 10.3 mg/dL 9.9  10.0  9.9   Total Protein 6.5 - 8.1 g/dL 7.5  7.9  7.5   Total Bilirubin 0.3 - 1.2 mg/dL 0.5  0.5  0.5   Alkaline Phos 38 - 126 U/L 69  74  74   AST 15 - 41 U/L $Remo'18  20  24   'zghnn$ ALT 0 - 44 U/L $Remo'14  21  31       'KihMF$ ASSESSMENT & PLAN:   66 year old female with  #1  newly diagnosed smoldering myeloma  CBC shows normal blood counts with no anemia Previous CMP with normal renal function no hypercalcemia  Bone marrow biopsy 12/21/2021: Mildly hypercellular bone marrow 40 to 50% cellularity with 16% plasma cells kappa light chain restricted (cytogenetics and FISH panel are pending)  Plan -I discussed all the work-up the patient has had thus far including her labs. Labs done today were reviewed in detail. CBC counts improved and stable. WBC count is 5.6k, Hgb is 12.8, and platelet count is 213k. CMP unremarkable. MM panel shows M protein spike of 1.4g/dl which is stable. LDH at 121. K/L light chain shows ratio of 50.55 -No indication for treatment of the patient's smoldering myeloma at this time. -We also discussed using Voltaren ointment as needed.  -We discussed -We discussed bone density done 03/02/2022 which revealed findings consistent with osteoporosis. -She will let us know about whether or not she would like to do Zometa/Prolea and if she would like to see the spine surgeon.  #2 patient with multilevel compression fractures with back pain Chronic # evaluated bi UR -- vertebroplasty not recommended.  Follow-up RTC with Dr Irene Limbo in 4 months Labs 1 week prior to clinic visit  No orders of the defined types were placed in this encounter.   The total time spent in the appointment was 21 minutes*.  All of the patient's questions were answered with apparent satisfaction. The patient knows to call the clinic with any problems, questions or concerns.   Sullivan Lone MD MS AAHIVMS [facility] Hematology/Oncology Physician [facility]  .*Total Encounter Time as defined by the Centers for Medicare and Medicaid Services includes, in addition to the face-to-face time of a patient visit (documented in the note above) non-face-to-face time: obtaining and reviewing outside history, ordering and reviewing medications, tests or procedures, care  coordination (communications with other health care professionals or caregivers) and documentation in the medical record.  I, Melene Muller, am acting as scribe for Dr. Sullivan Lone, MD.  .I have reviewed the above documentation for accuracy and completeness, and I agree with the above. Brunetta Genera MD

## 2022-04-20 LAB — KAPPA/LAMBDA LIGHT CHAINS
Kappa free light chain: 348.8 mg/L — ABNORMAL HIGH (ref 3.3–19.4)
Kappa, lambda light chain ratio: 50.55 — ABNORMAL HIGH (ref 0.26–1.65)
Lambda free light chains: 6.9 mg/L (ref 5.7–26.3)

## 2022-04-23 LAB — MULTIPLE MYELOMA PANEL, SERUM
Albumin SerPl Elph-Mcnc: 4.2 g/dL (ref 2.9–4.4)
Albumin/Glob SerPl: 1.6 (ref 0.7–1.7)
Alpha 1: 0.1 g/dL (ref 0.0–0.4)
Alpha2 Glob SerPl Elph-Mcnc: 0.7 g/dL (ref 0.4–1.0)
B-Globulin SerPl Elph-Mcnc: 1.8 g/dL — ABNORMAL HIGH (ref 0.7–1.3)
Gamma Glob SerPl Elph-Mcnc: 0.2 g/dL — ABNORMAL LOW (ref 0.4–1.8)
Globulin, Total: 2.8 g/dL (ref 2.2–3.9)
IgA: 39 mg/dL — ABNORMAL LOW (ref 87–352)
IgG (Immunoglobin G), Serum: 1067 mg/dL (ref 586–1602)
IgM (Immunoglobulin M), Srm: 31 mg/dL (ref 26–217)
M Protein SerPl Elph-Mcnc: 1.4 g/dL — ABNORMAL HIGH
Total Protein ELP: 7 g/dL (ref 6.0–8.5)

## 2022-04-27 ENCOUNTER — Other Ambulatory Visit: Payer: Medicare HMO

## 2022-04-30 ENCOUNTER — Encounter: Payer: Self-pay | Admitting: Hematology

## 2022-05-03 ENCOUNTER — Other Ambulatory Visit: Payer: Self-pay

## 2022-05-03 DIAGNOSIS — D472 Monoclonal gammopathy: Secondary | ICD-10-CM

## 2022-05-03 NOTE — Progress Notes (Signed)
Ambulatory

## 2022-05-19 DIAGNOSIS — D472 Monoclonal gammopathy: Secondary | ICD-10-CM | POA: Diagnosis not present

## 2022-05-19 DIAGNOSIS — E785 Hyperlipidemia, unspecified: Secondary | ICD-10-CM | POA: Diagnosis not present

## 2022-05-19 DIAGNOSIS — M549 Dorsalgia, unspecified: Secondary | ICD-10-CM | POA: Diagnosis not present

## 2022-05-19 DIAGNOSIS — Z0000 Encounter for general adult medical examination without abnormal findings: Secondary | ICD-10-CM | POA: Diagnosis not present

## 2022-05-19 DIAGNOSIS — R0989 Other specified symptoms and signs involving the circulatory and respiratory systems: Secondary | ICD-10-CM | POA: Diagnosis not present

## 2022-05-19 DIAGNOSIS — G4700 Insomnia, unspecified: Secondary | ICD-10-CM | POA: Diagnosis not present

## 2022-05-19 DIAGNOSIS — E039 Hypothyroidism, unspecified: Secondary | ICD-10-CM | POA: Diagnosis not present

## 2022-06-24 ENCOUNTER — Other Ambulatory Visit: Payer: Self-pay | Admitting: Hematology

## 2022-06-24 DIAGNOSIS — D472 Monoclonal gammopathy: Secondary | ICD-10-CM

## 2022-07-01 ENCOUNTER — Other Ambulatory Visit: Payer: Self-pay | Admitting: Hematology

## 2022-08-16 ENCOUNTER — Other Ambulatory Visit: Payer: Self-pay

## 2022-08-16 DIAGNOSIS — D472 Monoclonal gammopathy: Secondary | ICD-10-CM

## 2022-08-17 ENCOUNTER — Inpatient Hospital Stay: Payer: Medicare HMO | Attending: Hematology

## 2022-08-17 DIAGNOSIS — C9000 Multiple myeloma not having achieved remission: Secondary | ICD-10-CM | POA: Insufficient documentation

## 2022-08-17 DIAGNOSIS — D472 Monoclonal gammopathy: Secondary | ICD-10-CM

## 2022-08-17 LAB — CMP (CANCER CENTER ONLY)
ALT: 17 U/L (ref 0–44)
AST: 17 U/L (ref 15–41)
Albumin: 4.6 g/dL (ref 3.5–5.0)
Alkaline Phosphatase: 59 U/L (ref 38–126)
Anion gap: 8 (ref 5–15)
BUN: 9 mg/dL (ref 8–23)
CO2: 28 mmol/L (ref 22–32)
Calcium: 10.3 mg/dL (ref 8.9–10.3)
Chloride: 101 mmol/L (ref 98–111)
Creatinine: 0.89 mg/dL (ref 0.44–1.00)
GFR, Estimated: >60 mL/min (ref >60)
Glucose, Bld: 84 mg/dL (ref 70–99)
Potassium: 4 mmol/L (ref 3.5–5.1)
Sodium: 137 mmol/L (ref 135–145)
Total Bilirubin: 0.5 mg/dL (ref 0.3–1.2)
Total Protein: 7.5 g/dL (ref 6.5–8.1)

## 2022-08-17 LAB — CBC WITH DIFFERENTIAL (CANCER CENTER ONLY)
Abs Immature Granulocytes: 0.01 10*3/uL (ref 0.00–0.07)
Basophils Absolute: 0 10*3/uL (ref 0.0–0.1)
Basophils Relative: 1 %
Eosinophils Absolute: 0.1 10*3/uL (ref 0.0–0.5)
Eosinophils Relative: 2 %
HCT: 38.2 % (ref 36.0–46.0)
Hemoglobin: 13 g/dL (ref 12.0–15.0)
Immature Granulocytes: 0 %
Lymphocytes Relative: 42 %
Lymphs Abs: 1.6 10*3/uL (ref 0.7–4.0)
MCH: 35.3 pg — ABNORMAL HIGH (ref 26.0–34.0)
MCHC: 34 g/dL (ref 30.0–36.0)
MCV: 103.8 fL — ABNORMAL HIGH (ref 80.0–100.0)
Monocytes Absolute: 0.3 10*3/uL (ref 0.1–1.0)
Monocytes Relative: 9 %
Neutro Abs: 1.7 10*3/uL (ref 1.7–7.7)
Neutrophils Relative %: 46 %
Platelet Count: 195 10*3/uL (ref 150–400)
RBC: 3.68 MIL/uL — ABNORMAL LOW (ref 3.87–5.11)
RDW: 13.2 % (ref 11.5–15.5)
WBC Count: 3.7 10*3/uL — ABNORMAL LOW (ref 4.0–10.5)
nRBC: 0 % (ref 0.0–0.2)

## 2022-08-17 LAB — LACTATE DEHYDROGENASE: LDH: 109 U/L (ref 98–192)

## 2022-08-18 LAB — KAPPA/LAMBDA LIGHT CHAINS
Kappa free light chain: 532.6 mg/L — ABNORMAL HIGH (ref 3.3–19.4)
Kappa, lambda light chain ratio: 72.96 — ABNORMAL HIGH (ref 0.26–1.65)
Lambda free light chains: 7.3 mg/L (ref 5.7–26.3)

## 2022-08-23 LAB — MULTIPLE MYELOMA PANEL, SERUM
Albumin SerPl Elph-Mcnc: 4.3 g/dL (ref 2.9–4.4)
Albumin/Glob SerPl: 1.7 (ref 0.7–1.7)
Alpha 1: 0.1 g/dL (ref 0.0–0.4)
Alpha2 Glob SerPl Elph-Mcnc: 0.6 g/dL (ref 0.4–1.0)
B-Globulin SerPl Elph-Mcnc: 1.7 g/dL — ABNORMAL HIGH (ref 0.7–1.3)
Gamma Glob SerPl Elph-Mcnc: 0.2 g/dL — ABNORMAL LOW (ref 0.4–1.8)
Globulin, Total: 2.6 g/dL (ref 2.2–3.9)
IgA: 43 mg/dL — ABNORMAL LOW (ref 87–352)
IgG (Immunoglobin G), Serum: 1167 mg/dL (ref 586–1602)
IgM (Immunoglobulin M), Srm: 26 mg/dL (ref 26–217)
M Protein SerPl Elph-Mcnc: 1.3 g/dL — ABNORMAL HIGH
Total Protein ELP: 6.9 g/dL (ref 6.0–8.5)

## 2022-08-24 ENCOUNTER — Inpatient Hospital Stay (HOSPITAL_BASED_OUTPATIENT_CLINIC_OR_DEPARTMENT_OTHER): Payer: Medicare HMO | Admitting: Hematology

## 2022-08-24 ENCOUNTER — Other Ambulatory Visit: Payer: Self-pay

## 2022-08-24 VITALS — BP 132/59 | HR 77 | Temp 97.8°F | Resp 15 | Ht 62.0 in | Wt 107.7 lb

## 2022-08-24 DIAGNOSIS — C9000 Multiple myeloma not having achieved remission: Secondary | ICD-10-CM | POA: Diagnosis not present

## 2022-08-24 DIAGNOSIS — D472 Monoclonal gammopathy: Secondary | ICD-10-CM | POA: Diagnosis not present

## 2022-08-24 MED ORDER — OXYCODONE-ACETAMINOPHEN 5-325 MG PO TABS: 1.0000 | ORAL_TABLET | Freq: Four times a day (QID) | ORAL | 0 refills | Status: DC | PRN

## 2022-08-24 NOTE — Progress Notes (Signed)
Hematology oncology outpatient clinic note  DOS: 08/24/2022  Patient Care Team: London Pepper, MD as PCP - General (Family Medicine)  DIAGNOSIS:  Recently diagnosed smoldering myeloma  CHIEF COMPLIANT: Referred for continued evaluation and management of plasma cell dyscrasia.  HISTORY OF PRESENTING ILLNESS: Please see previous note for details on initial presentation  INTERVAL HISTORY:   Kiara Olson is a 66 y.o. female here for continued evaluation and management of newly diagnosed smoldering myeloma.   Patient was last seen by me on 04/19/2022 and was doing well overall.   Patient reports she is doing well overall since our last visit. She complains of consistent lower back pain since the last visit. She has been to interventional radiologist and the radiologist noted that they cannot proceed with dry needling due to osteoporosis.   She had questions about Zometa/Prolea, which were answered. Educated patient on side effects of Zometa/Prolea.   Patient had many questions regarding medication, Zometa/Prolea, myeloma, and osteoporosis, which were answered.   Patient complains loss of appetite which has caused her to lose weight. She notes that she forces herself to eat even tough she does not have a desire to eat. She is still losing weight even when she is forcing herself to eat. Patient reports her thyroid levels are stable. She has lost about 8 lbs since May 2023.   She denies any dental issues, but will get in touch with her dentist to get cleared for Zometa/Prolia if she decides to go with this treatment option.    ALLERGIES:  is allergic to lidocaine.  MEDICATIONS:  Current Outpatient Medications  Medication Sig Dispense Refill   ARMOUR THYROID 30 MG tablet Take 30 mg by mouth every morning.     LORazepam (ATIVAN) 1 MG tablet Take 1.5 mg by mouth at bedtime.     methocarbamol (ROBAXIN) 500 MG tablet Take 1,000 mg by mouth every 6 (six) hours.     OVER THE COUNTER  MEDICATION Apply 1 application topically daily. Magnesium oil     oxyCODONE-acetaminophen (PERCOCET/ROXICET) 5-325 MG tablet Take 1 tablet by mouth every 6 (six) hours as needed for severe pain. For compression fracture in spine 60 tablet 0   Vitamin D-Vitamin K (VITAMIN K2-VITAMIN D3 PO) Place 2.5 drops under the tongue daily.     No current facility-administered medications for this visit.    PHYSICAL EXAMINATION: ECOG PERFORMANCE STATUS: 1 - Symptomatic but completely ambulatory  Vitals:   08/24/22 1311  BP: (!) 132/59  Pulse: 77  Resp: 15  Temp: 97.8 F (36.6 C)  SpO2: 99%   Filed Weights   08/24/22 1311  Weight: 107 lb 11.2 oz (48.9 kg)  NAD GENERAL:alert, in no acute distress and comfortable SKIN: no acute rashes, no significant lesions EYES: conjunctiva are pink and non-injected, sclera anicteric NECK: supple, no JVD LYMPH:  no palpable lymphadenopathy in the cervical, axillary or inguinal regions LUNGS: clear to auscultation b/l with normal respiratory effort HEART: regular rate & rhythm ABDOMEN:  normoactive bowel sounds , non tender, not distended. Extremity: no pedal edema PSYCH: alert & oriented x 3 with fluent speech NEURO: no focal motor/sensory deficits  LABORATORY DATA:  I have reviewed the data as listed.        Latest Ref Rng & Units 08/17/2022    1:17 PM 04/19/2022   12:55 PM 02/17/2022   11:27 AM  CMP  Glucose 70 - 99 mg/dL 84  74  94   BUN 8 - 23 mg/dL 9  19  13   Creatinine 0.44 - 1.00 mg/dL 0.89  0.91  0.99   Sodium 135 - 145 mmol/L 137  136  137   Potassium 3.5 - 5.1 mmol/L 4.0  4.2  3.8   Chloride 98 - 111 mmol/L 101  101  101   CO2 22 - 32 mmol/L _0 Calcium 8.9 - 10.3 mg/dL 10.3  9.9  10.0   Total Protein 6.5 - 8.1 g/dL 7.5  7.5  7.9   Total Bilirubin 0.3 - 1.2 mg/dL 0.5  0.5  0.5   Alkaline Phos 38 - 126 U/L 59  69  74   AST 15 - 41 U/L _1 ALT 0 - 44 U/L _2 .    Latest Ref Rng & Units 08/17/2022     1:17 PM 04/19/2022   12:55 PM 02/17/2022   11:27 AM  CBC  WBC 4.0 - 10.5 K/uL 3.7  5.6  4.8   Hemoglobin 12.0 - 15.0 g/dL 13.0  12.8  13.2   Hematocrit 36.0 - 46.0 % 38.2  35.8  37.5   Platelets 150 - 400 K/uL 195  213  228       ASSESSMENT & PLAN:   66 year old female with  #1 newly diagnosed smoldering myeloma  CBC shows normal blood counts with no anemia Previous CMP with normal renal function no hypercalcemia  Bone marrow biopsy 12/21/2021: Mildly hypercellular bone marrow 40 to 50% cellularity with 16% plasma cells kappa light chain restricted (cytogenetics and FISH panel are pending)  #2 patient with multilevel compression fractures with back pain Chronic # evaluated bi UR -- vertebroplasty not recommended.  Plan: -Labs done today were reviewed in detail. CBC counts improved and stable. WBC count is 3.7 k, Hgb is 13.0 K, and platelet count is 195 K. -MM panel shows M protein spike of 1.3g/dl which is stable. Kappa Lambda light chain ratio slightly increased from 50.55 to 72.96. -No indication for treatment of the patient's smoldering myeloma at this time. -We discussed bone density done 03/02/2022 which revealed findings consistent with osteoporosis. -She will let us know about whether or not she would like to do Zometa/Prolia and if she would like to see the spine surgeon. -Discussed the options for back pain management: Option 1: follow-up with PCP for oral osteoporosis management. Option 2: Injection every 6 months. Discussed the pros and cons for oral medication vs injection,  -Discussed the side effects of Zometa/Prolia  -Recommend influenza vaccine, COVID-19 Booster, and RSV vaccine.  -Patient will wait until the next visit for treatment decision.  -Patient will get dental clearance.  -Will prescribe percocet for pain management for her compression fractures x 1 prescription. Subsequent ongoing pain mx for non cancer pain would to come from her PCP or PCP could refer  her to pain management clinic  Follow-up: Phone visit with Dr Irene Limbo in 4 months Labs 1 week prior to phone visit  The total time spent in the appointment was 30 minutes* .  All of the patient's questions were answered with apparent satisfaction. The patient knows to call the clinic with any problems, questions or concerns.   Sullivan Lone MD MS AAHIVMS [facility] Hematology/Oncology Physician [facility]  .*Total Encounter Time as defined by the Centers for Medicare and Medicaid Services includes, in addition to the face-to-face time of a patient visit (documented in the note above)  non-face-to-face time: obtaining and reviewing outside history, ordering and reviewing medications, tests or procedures, care coordination (communications with other health care professionals or caregivers) and documentation in the medical record.   I, Cleda Mccreedy, am acting as a Education administrator for Sullivan Lone, MD. .I have reviewed the above documentation for accuracy and completeness, and I agree with the above. Brunetta Genera MD

## 2022-09-06 DIAGNOSIS — M546 Pain in thoracic spine: Secondary | ICD-10-CM | POA: Diagnosis not present

## 2022-09-07 DIAGNOSIS — M546 Pain in thoracic spine: Secondary | ICD-10-CM | POA: Diagnosis not present

## 2022-10-07 DIAGNOSIS — M546 Pain in thoracic spine: Secondary | ICD-10-CM | POA: Diagnosis not present

## 2022-10-14 DIAGNOSIS — M810 Age-related osteoporosis without current pathological fracture: Secondary | ICD-10-CM | POA: Diagnosis not present

## 2022-10-14 DIAGNOSIS — M549 Dorsalgia, unspecified: Secondary | ICD-10-CM | POA: Diagnosis not present

## 2022-10-14 DIAGNOSIS — M479 Spondylosis, unspecified: Secondary | ICD-10-CM | POA: Diagnosis not present

## 2022-10-26 ENCOUNTER — Telehealth: Payer: Self-pay | Admitting: Hematology

## 2022-10-26 NOTE — Telephone Encounter (Signed)
Called patient per provider PAL. Left voicemail for patient to call patient to reschedule appointments.

## 2022-10-27 ENCOUNTER — Telehealth: Payer: Self-pay | Admitting: Hematology

## 2022-10-27 NOTE — Telephone Encounter (Signed)
Called patient per provider PAL to r/s appointments. Left voicemail with new appointment information as well as direct contact information if needing to reschedule.

## 2022-11-02 DIAGNOSIS — M546 Pain in thoracic spine: Secondary | ICD-10-CM | POA: Diagnosis not present

## 2022-11-09 DIAGNOSIS — M546 Pain in thoracic spine: Secondary | ICD-10-CM | POA: Diagnosis not present

## 2022-11-19 DIAGNOSIS — D472 Monoclonal gammopathy: Secondary | ICD-10-CM | POA: Diagnosis not present

## 2022-11-19 DIAGNOSIS — E785 Hyperlipidemia, unspecified: Secondary | ICD-10-CM | POA: Diagnosis not present

## 2022-11-19 DIAGNOSIS — E039 Hypothyroidism, unspecified: Secondary | ICD-10-CM | POA: Diagnosis not present

## 2022-11-29 ENCOUNTER — Other Ambulatory Visit: Payer: Self-pay

## 2022-11-29 DIAGNOSIS — D472 Monoclonal gammopathy: Secondary | ICD-10-CM

## 2022-11-30 ENCOUNTER — Other Ambulatory Visit (HOSPITAL_BASED_OUTPATIENT_CLINIC_OR_DEPARTMENT_OTHER): Payer: Self-pay | Admitting: Family Medicine

## 2022-11-30 DIAGNOSIS — E785 Hyperlipidemia, unspecified: Secondary | ICD-10-CM

## 2022-12-01 ENCOUNTER — Other Ambulatory Visit: Payer: Self-pay

## 2022-12-01 ENCOUNTER — Inpatient Hospital Stay: Payer: Medicare HMO | Attending: Nurse Practitioner

## 2022-12-01 DIAGNOSIS — D472 Monoclonal gammopathy: Secondary | ICD-10-CM

## 2022-12-01 DIAGNOSIS — C9000 Multiple myeloma not having achieved remission: Secondary | ICD-10-CM | POA: Diagnosis not present

## 2022-12-01 LAB — CBC WITH DIFFERENTIAL (CANCER CENTER ONLY)
Abs Immature Granulocytes: 0 10*3/uL (ref 0.00–0.07)
Basophils Absolute: 0 10*3/uL (ref 0.0–0.1)
Basophils Relative: 1 %
Eosinophils Absolute: 0 10*3/uL (ref 0.0–0.5)
Eosinophils Relative: 1 %
HCT: 36 % (ref 36.0–46.0)
Hemoglobin: 12.6 g/dL (ref 12.0–15.0)
Immature Granulocytes: 0 %
Lymphocytes Relative: 29 %
Lymphs Abs: 1.3 10*3/uL (ref 0.7–4.0)
MCH: 35.5 pg — ABNORMAL HIGH (ref 26.0–34.0)
MCHC: 35 g/dL (ref 30.0–36.0)
MCV: 101.4 fL — ABNORMAL HIGH (ref 80.0–100.0)
Monocytes Absolute: 0.3 10*3/uL (ref 0.1–1.0)
Monocytes Relative: 7 %
Neutro Abs: 2.8 10*3/uL (ref 1.7–7.7)
Neutrophils Relative %: 62 %
Platelet Count: 213 10*3/uL (ref 150–400)
RBC: 3.55 MIL/uL — ABNORMAL LOW (ref 3.87–5.11)
RDW: 13.1 % (ref 11.5–15.5)
WBC Count: 4.4 10*3/uL (ref 4.0–10.5)
nRBC: 0 % (ref 0.0–0.2)

## 2022-12-01 LAB — CMP (CANCER CENTER ONLY)
ALT: 16 U/L (ref 0–44)
AST: 15 U/L (ref 15–41)
Albumin: 4.3 g/dL (ref 3.5–5.0)
Alkaline Phosphatase: 70 U/L (ref 38–126)
Anion gap: 8 (ref 5–15)
BUN: 13 mg/dL (ref 8–23)
CO2: 27 mmol/L (ref 22–32)
Calcium: 10 mg/dL (ref 8.9–10.3)
Chloride: 104 mmol/L (ref 98–111)
Creatinine: 1.02 mg/dL — ABNORMAL HIGH (ref 0.44–1.00)
GFR, Estimated: 60 mL/min — ABNORMAL LOW (ref >60)
Glucose, Bld: 89 mg/dL (ref 70–99)
Potassium: 4 mmol/L (ref 3.5–5.1)
Sodium: 139 mmol/L (ref 135–145)
Total Bilirubin: 0.4 mg/dL (ref 0.3–1.2)
Total Protein: 7.3 g/dL (ref 6.5–8.1)

## 2022-12-01 LAB — LACTATE DEHYDROGENASE: LDH: 111 U/L (ref 98–192)

## 2022-12-02 LAB — KAPPA/LAMBDA LIGHT CHAINS
Kappa free light chain: 431 mg/L — ABNORMAL HIGH (ref 3.3–19.4)
Kappa, lambda light chain ratio: 56.71 — ABNORMAL HIGH (ref 0.26–1.65)
Lambda free light chains: 7.6 mg/L (ref 5.7–26.3)

## 2022-12-06 LAB — MULTIPLE MYELOMA PANEL, SERUM
Albumin SerPl Elph-Mcnc: 4.2 g/dL (ref 2.9–4.4)
Albumin/Glob SerPl: 1.7 (ref 0.7–1.7)
Alpha 1: 0.1 g/dL (ref 0.0–0.4)
Alpha2 Glob SerPl Elph-Mcnc: 0.6 g/dL (ref 0.4–1.0)
B-Globulin SerPl Elph-Mcnc: 1.7 g/dL — ABNORMAL HIGH (ref 0.7–1.3)
Gamma Glob SerPl Elph-Mcnc: 0.2 g/dL — ABNORMAL LOW (ref 0.4–1.8)
Globulin, Total: 2.6 g/dL (ref 2.2–3.9)
IgA: 37 mg/dL — ABNORMAL LOW (ref 87–352)
IgG (Immunoglobin G), Serum: 1067 mg/dL (ref 586–1602)
IgM (Immunoglobulin M), Srm: 33 mg/dL (ref 26–217)
M Protein SerPl Elph-Mcnc: 1.1 g/dL — ABNORMAL HIGH
Total Protein ELP: 6.8 g/dL (ref 6.0–8.5)

## 2022-12-07 DIAGNOSIS — M546 Pain in thoracic spine: Secondary | ICD-10-CM | POA: Diagnosis not present

## 2022-12-08 ENCOUNTER — Inpatient Hospital Stay: Payer: Medicare HMO | Admitting: Hematology

## 2022-12-08 DIAGNOSIS — C9000 Multiple myeloma not having achieved remission: Secondary | ICD-10-CM | POA: Diagnosis not present

## 2022-12-08 DIAGNOSIS — D472 Monoclonal gammopathy: Secondary | ICD-10-CM | POA: Diagnosis not present

## 2022-12-08 NOTE — Progress Notes (Signed)
Hematology oncology outpatient phone visit note  DOS: 12/08/2022  Patient Care Team: London Pepper, MD as PCP - General (Family Medicine)  DIAGNOSIS:  smoldering myeloma  CHIEF COMPLIANT: continued evaluation and management of plasma cell dyscrasia.  HISTORY OF PRESENTING ILLNESS: Please see previous note for details on initial presentation  INTERVAL HISTORY:   Kiara Olson is a 67 y.o. female here for continued evaluation and management of newly diagnosed smoldering myeloma. Patient was last seen by me on 08/24/2022 and complained of consistent lower back pain and loss of appetite, which caused weight loss.  I connected with Concepcion Elk on 12/08/22 at 12:00 PM EDT by telephone visit and verified that I am speaking with the correct person using two identifiers.   I discussed the limitations, risks, security and privacy concerns of performing an evaluation and management service by telemedicine and the availability of in-person appointments. I also discussed with the patient that there may be a patient responsible charge related to this service. The patient expressed understanding and agreed to proceed.   Other persons participating in the visit and their role in the encounter: none   Patient's location: home  Provider's location: [facility]   Chief Complaint: continued evaluation and management of plasma cell dyscrasia.   Today, she reports that her back pain has improved and she has not used her back brace for the last three months. She does follow up with her pain management doctor and engages in physical therapy to improve symptoms.   She denies any new bone pains or unexpected weight loss, and adds that she has recently gained 3 pounds. She denies any unexplained fevers, chills, night sweats, major medication changes, or recent infection issues.  She has not seen dentist yet for dental clearance as she is not ready for medication for her osteoporosis at this time. She  reports that she is working on her diet and exercise for the next 6 months.  She does take 5 MG Flexeril as needed, which has improved her symptoms. She has only used this muscle relaxant a few times and does note that she does have some Percocet remaining.    ALLERGIES:  is allergic to lidocaine.  MEDICATIONS:  Current Outpatient Medications  Medication Sig Dispense Refill   ARMOUR THYROID 30 MG tablet Take 30 mg by mouth every morning.     LORazepam (ATIVAN) 1 MG tablet Take 1.5 mg by mouth at bedtime.     methocarbamol (ROBAXIN) 500 MG tablet Take 1,000 mg by mouth every 6 (six) hours.     OVER THE COUNTER MEDICATION Apply 1 application topically daily. Magnesium oil     oxyCODONE-acetaminophen (PERCOCET/ROXICET) 5-325 MG tablet Take 1 tablet by mouth every 6 (six) hours as needed for severe pain. For compression fracture in spine 60 tablet 0   Vitamin D-Vitamin K (VITAMIN K2-VITAMIN D3 PO) Place 2.5 drops under the tongue daily.     No current facility-administered medications for this visit.    PHYSICAL EXAMINATION: TELEPHONE VISIT 20 LABORATORY DATA:  I have reviewed the data as listed.        Latest Ref Rng & Units 12/01/2022   12:55 PM 08/17/2022    1:17 PM 04/19/2022   12:55 PM  CMP  Glucose 70 - 99 mg/dL 89  84  74   BUN 8 - 23 mg/dL 13  9  19    Creatinine 0.44 - 1.00 mg/dL 1.02  0.89  0.91   Sodium 135 - 145 mmol/L 139  137  136   Potassium 3.5 - 5.1 mmol/L 4.0  4.0  4.2   Chloride 98 - 111 mmol/L 104  101  101   CO2 22 - 32 mmol/L 27  28  23    Calcium 8.9 - 10.3 mg/dL 10.0  10.3  9.9   Total Protein 6.5 - 8.1 g/dL 7.3  7.5  7.5   Total Bilirubin 0.3 - 1.2 mg/dL 0.4  0.5  0.5   Alkaline Phos 38 - 126 U/L 70  59  69   AST 15 - 41 U/L 15  17  18    ALT 0 - 44 U/L 16  17  14     .    Latest Ref Rng & Units 12/01/2022   12:55 PM 08/17/2022    1:17 PM 04/19/2022   12:55 PM  CBC  WBC 4.0 - 10.5 K/uL 4.4  3.7  5.6   Hemoglobin 12.0 - 15.0 g/dL 12.6  13.0  12.8    Hematocrit 36.0 - 46.0 % 36.0  38.2  35.8   Platelets 150 - 400 K/uL 213  195  213     ASSESSMENT & PLAN:   67 year old female with  #1 newly diagnosed smoldering myeloma  CBC shows normal blood counts with no anemia Previous CMP with normal renal function no hypercalcemia  Bone marrow biopsy 12/21/2021: Mildly hypercellular bone marrow 40 to 50% cellularity with 16% plasma cells kappa light chain restricted (cytogenetics and FISH panel are pending)  #2 patient with multilevel compression fractures with back pain Chronic # evaluated bi UR -- vertebroplasty not recommended.  Plan:  -Discussed lab results on 12/01/2022 with patient. CBC showed WBC of 4.4K, hemoglobin of 12.6, and platelets of 213K. -no new anemia at this time -CMP stable no hypercalcimia or significant change in kidney -Myeloma lab improved and revealed M spike 1.1 g/dL KL light chain stable in 400s -Patient has no lab or clinical evidence of active myeloma progression at this time. -No indication for treatment of the patient's smoldering myeloma at this time. -repeats labs every 4 months -Patient would like to hold off on receiving Zometa at this time and would like to try diet and exercise to optimize her bone health  Follow-up: Phone visit with Dr Irene Limbo in 4 months Labs 1 week prior to phone visit  The total time spent in the appointment was 20 minutes* .  All of the patient's questions were answered with apparent satisfaction. The patient knows to call the clinic with any problems, questions or concerns.   Sullivan Lone MD MS AAHIVMS [facility] Hematology/Oncology Physician [facility]  .*Total Encounter Time as defined by the Centers for Medicare and Medicaid Services includes, in addition to the face-to-face time of a patient visit (documented in the note above) non-face-to-face time: obtaining and reviewing outside history, ordering and reviewing medications, tests or procedures, care  coordination (communications with other health care professionals or caregivers) and documentation in the medical record.   I,Mitra Faeizi,acting as a Education administrator for Sullivan Lone, MD.,have documented all relevant documentation on the behalf of Sullivan Lone, MD,as directed by  Sullivan Lone, MD while in the presence of Sullivan Lone, MD.  .I have reviewed the above documentation for accuracy and completeness, and I agree with the above. Brunetta Genera MD

## 2022-12-15 ENCOUNTER — Other Ambulatory Visit: Payer: Medicare HMO

## 2022-12-22 ENCOUNTER — Telehealth: Payer: Medicare HMO | Admitting: Hematology

## 2023-01-04 ENCOUNTER — Ambulatory Visit (HOSPITAL_BASED_OUTPATIENT_CLINIC_OR_DEPARTMENT_OTHER): Admission: RE | Admit: 2023-01-04 | Payer: Medicare HMO | Source: Ambulatory Visit

## 2023-01-04 ENCOUNTER — Encounter (HOSPITAL_BASED_OUTPATIENT_CLINIC_OR_DEPARTMENT_OTHER): Payer: Self-pay

## 2023-04-05 ENCOUNTER — Other Ambulatory Visit: Payer: Self-pay

## 2023-04-05 DIAGNOSIS — D472 Monoclonal gammopathy: Secondary | ICD-10-CM

## 2023-04-06 ENCOUNTER — Inpatient Hospital Stay: Payer: Medicare HMO | Attending: Hematology

## 2023-04-06 DIAGNOSIS — Z79899 Other long term (current) drug therapy: Secondary | ICD-10-CM | POA: Diagnosis not present

## 2023-04-06 DIAGNOSIS — D472 Monoclonal gammopathy: Secondary | ICD-10-CM | POA: Diagnosis not present

## 2023-04-06 LAB — CMP (CANCER CENTER ONLY)
ALT: 22 U/L (ref 0–44)
AST: 24 U/L (ref 15–41)
Albumin: 4.3 g/dL (ref 3.5–5.0)
Alkaline Phosphatase: 88 U/L (ref 38–126)
Anion gap: 9 (ref 5–15)
BUN: 21 mg/dL (ref 8–23)
CO2: 25 mmol/L (ref 22–32)
Calcium: 10.2 mg/dL (ref 8.9–10.3)
Chloride: 103 mmol/L (ref 98–111)
Creatinine: 1.18 mg/dL — ABNORMAL HIGH (ref 0.44–1.00)
GFR, Estimated: 50 mL/min — ABNORMAL LOW (ref >60)
Glucose, Bld: 84 mg/dL (ref 70–99)
Potassium: 4.1 mmol/L (ref 3.5–5.1)
Sodium: 137 mmol/L (ref 135–145)
Total Bilirubin: 0.5 mg/dL (ref 0.3–1.2)
Total Protein: 7.6 g/dL (ref 6.5–8.1)

## 2023-04-06 LAB — CBC WITH DIFFERENTIAL (CANCER CENTER ONLY)
Abs Immature Granulocytes: 0.01 10*3/uL (ref 0.00–0.07)
Basophils Absolute: 0 10*3/uL (ref 0.0–0.1)
Basophils Relative: 0 %
Eosinophils Absolute: 0 10*3/uL (ref 0.0–0.5)
Eosinophils Relative: 1 %
HCT: 37.3 % (ref 36.0–46.0)
Hemoglobin: 12.7 g/dL (ref 12.0–15.0)
Immature Granulocytes: 0 %
Lymphocytes Relative: 19 %
Lymphs Abs: 1.1 10*3/uL (ref 0.7–4.0)
MCH: 34.7 pg — ABNORMAL HIGH (ref 26.0–34.0)
MCHC: 34 g/dL (ref 30.0–36.0)
MCV: 101.9 fL — ABNORMAL HIGH (ref 80.0–100.0)
Monocytes Absolute: 0.3 10*3/uL (ref 0.1–1.0)
Monocytes Relative: 6 %
Neutro Abs: 4.4 10*3/uL (ref 1.7–7.7)
Neutrophils Relative %: 74 %
Platelet Count: 233 10*3/uL (ref 150–400)
RBC: 3.66 MIL/uL — ABNORMAL LOW (ref 3.87–5.11)
RDW: 12.6 % (ref 11.5–15.5)
WBC Count: 6 10*3/uL (ref 4.0–10.5)
nRBC: 0 % (ref 0.0–0.2)

## 2023-04-06 LAB — LACTATE DEHYDROGENASE: LDH: 128 U/L (ref 98–192)

## 2023-04-07 LAB — KAPPA/LAMBDA LIGHT CHAINS
Kappa free light chain: 459.9 mg/L — ABNORMAL HIGH (ref 3.3–19.4)
Kappa, lambda light chain ratio: 69.68 — ABNORMAL HIGH (ref 0.26–1.65)
Lambda free light chains: 6.6 mg/L (ref 5.7–26.3)

## 2023-04-11 LAB — MULTIPLE MYELOMA PANEL, SERUM
Albumin SerPl Elph-Mcnc: 4.1 g/dL (ref 2.9–4.4)
Albumin/Glob SerPl: 1.5 (ref 0.7–1.7)
Alpha 1: 0.1 g/dL (ref 0.0–0.4)
Alpha2 Glob SerPl Elph-Mcnc: 0.7 g/dL (ref 0.4–1.0)
B-Globulin SerPl Elph-Mcnc: 1.9 g/dL — ABNORMAL HIGH (ref 0.7–1.3)
Gamma Glob SerPl Elph-Mcnc: 0.2 g/dL — ABNORMAL LOW (ref 0.4–1.8)
Globulin, Total: 2.9 g/dL (ref 2.2–3.9)
IgA: 36 mg/dL — ABNORMAL LOW (ref 87–352)
IgG (Immunoglobin G), Serum: 1097 mg/dL (ref 586–1602)
IgM (Immunoglobulin M), Srm: 26 mg/dL (ref 26–217)
M Protein SerPl Elph-Mcnc: 1.2 g/dL — ABNORMAL HIGH
Total Protein ELP: 7 g/dL (ref 6.0–8.5)

## 2023-04-12 ENCOUNTER — Inpatient Hospital Stay (HOSPITAL_BASED_OUTPATIENT_CLINIC_OR_DEPARTMENT_OTHER): Payer: Medicare HMO | Admitting: Hematology

## 2023-04-12 DIAGNOSIS — Z79899 Other long term (current) drug therapy: Secondary | ICD-10-CM | POA: Diagnosis not present

## 2023-04-12 DIAGNOSIS — D472 Monoclonal gammopathy: Secondary | ICD-10-CM | POA: Diagnosis not present

## 2023-04-12 NOTE — Progress Notes (Signed)
Hematology oncology outpatient phone visit note  DOS: 04/12/2023  Patient Care Team: Farris Has, MD as PCP - General (Family Medicine)  DIAGNOSIS:  smoldering myeloma  CHIEF COMPLIANT: continued evaluation and management of plasma cell dyscrasia.  HISTORY OF PRESENTING ILLNESS: Please see previous note for details on initial presentation  INTERVAL HISTORY:   Kiara Olson is a 67 y.o. female here for continued evaluation and management of newly diagnosed smoldering myeloma. Patient was last seen by me on 12/08/2022 and was doing well overall.  I connected with [name] on 04/12/23 at  3:30 PM EDT by telephone visit and verified that I am speaking with the correct person using two identifiers.   I discussed the limitations, risks, security and privacy concerns of performing an evaluation and management service by telemedicine and the availability of in-person appointments. I also discussed with the patient that there may be a patient responsible charge related to this service. The patient expressed understanding and agreed to proceed.   Other persons participating in the visit and their role in the encounter: none   Patient's location: home  Provider's location: [facility]   Chief Complaint: continued evaluation and management of plasma cell dyscrasia.     Today, she reports that she has been doing well overall over the last 4 months. She denies any new localized symptoms, bone pain, fever, chills, night sweats, or major changes in medications. Her back pain has improved and she is able to exercise regularly.   The results of her recent lab workup was discussed with her in detail.   ALLERGIES:  is allergic to lidocaine.  MEDICATIONS:  Current Outpatient Medications  Medication Sig Dispense Refill   ARMOUR THYROID 30 MG tablet Take 30 mg by mouth every morning.     LORazepam (ATIVAN) 1 MG tablet Take 1.5 mg by mouth at bedtime.     methocarbamol (ROBAXIN) 500 MG  tablet Take 1,000 mg by mouth every 6 (six) hours.     OVER THE COUNTER MEDICATION Apply 1 application topically daily. Magnesium oil     oxyCODONE-acetaminophen (PERCOCET/ROXICET) 5-325 MG tablet Take 1 tablet by mouth every 6 (six) hours as needed for severe pain. For compression fracture in spine 60 tablet 0   Vitamin D-Vitamin K (VITAMIN K2-VITAMIN D3 PO) Place 2.5 drops under the tongue daily.     No current facility-administered medications for this visit.   REVIEW OF SYSTEMS:  10 Point review of Systems was done is negative except as noted above.   PHYSICAL EXAMINATION: TELEPHONE VISIT  LABORATORY DATA:  I have reviewed the data as listed.        Latest Ref Rng & Units 04/06/2023   12:55 PM 12/01/2022   12:55 PM 08/17/2022    1:17 PM  CMP  Glucose 70 - 99 mg/dL 84  89  84   BUN 8 - 23 mg/dL 21  13  9    Creatinine 0.44 - 1.00 mg/dL 2.13  0.86  5.78   Sodium 135 - 145 mmol/L 137  139  137   Potassium 3.5 - 5.1 mmol/L 4.1  4.0  4.0   Chloride 98 - 111 mmol/L 103  104  101   CO2 22 - 32 mmol/L 25  27  28    Calcium 8.9 - 10.3 mg/dL 46.9  62.9  52.8   Total Protein 6.5 - 8.1 g/dL 7.6  7.3  7.5   Total Bilirubin 0.3 - 1.2 mg/dL 0.5  0.4  0.5  Alkaline Phos 38 - 126 U/L 88  70  59   AST 15 - 41 U/L 24  15  17    ALT 0 - 44 U/L 22  16  17     .    Latest Ref Rng & Units 04/06/2023   12:55 PM 12/01/2022   12:55 PM 08/17/2022    1:17 PM  CBC  WBC 4.0 - 10.5 K/uL 6.0  4.4  3.7   Hemoglobin 12.0 - 15.0 g/dL 41.3  24.4  01.0   Hematocrit 36.0 - 46.0 % 37.3  36.0  38.2   Platelets 150 - 400 K/uL 233  213  195     ASSESSMENT & PLAN:   67 year old female with  #1 newly diagnosed smoldering myeloma  CBC shows normal blood counts with no anemia Previous CMP with normal renal function no hypercalcemia  Bone marrow biopsy 12/21/2021: Mildly hypercellular bone marrow 40 to 50% cellularity with 16% plasma cells kappa light chain restricted (cytogenetics and FISH panel are  pending)  #2 patient with multilevel compression fractures with back pain Chronic # evaluated bi UR -- vertebroplasty not recommended.  Plan:  -Discussed lab results from 04/06/2023 in detail with patient. CBC normal, showed WBC of 6.0K, hemoglobin of 12.7, and platelets of 233K. -no anemia -CMP shows stable kidney function, no elevation of calcium levels -LDH normal -K/L light chain ratio stable at 69.68 -Kappa free light chain stable at 459.9 -M protein stable at 1.2, previously fluctuating between 1.1-1.4 -Patient has no lab or clinical evidence of active myeloma progression at this time. -No indication for treatment of the patient's smoldering myeloma at this time. -Patient would like to continue to hold off on receiving Zometa at this time and focus on diet and exercise to optimize her bone health -continue to consume a well balanced diet and exercise regularly -will continue to monitor with labs in 4 months  Follow-up: Phone visit with Dr Candise Che in 4 months Labs 1 week prior to phone visit  The total time spent in the appointment was 20 minutes* .  All of the patient's questions were answered with apparent satisfaction. The patient knows to call the clinic with any problems, questions or concerns.   Wyvonnia Lora MD MS AAHIVMS [facility] Hematology/Oncology Physician [facility]  .*Total Encounter Time as defined by the Centers for Medicare and Medicaid Services includes, in addition to the face-to-face time of a patient visit (documented in the note above) non-face-to-face time: obtaining and reviewing outside history, ordering and reviewing medications, tests or procedures, care coordination (communications with other health care professionals or caregivers) and documentation in the medical record.    I,Mitra Faeizi,acting as a Neurosurgeon for Wyvonnia Lora, MD.,have documented all relevant documentation on the behalf of Wyvonnia Lora, MD,as directed by  Wyvonnia Lora, MD while  in the presence of Wyvonnia Lora, MD.  .I have reviewed the above documentation for accuracy and completeness, and I agree with the above. Johney Maine MD

## 2023-04-14 ENCOUNTER — Telehealth: Payer: Self-pay | Admitting: Hematology

## 2023-04-14 NOTE — Telephone Encounter (Signed)
Patient is aware of upcoming appointment times/dates.  

## 2023-07-22 ENCOUNTER — Other Ambulatory Visit: Payer: Self-pay

## 2023-07-22 DIAGNOSIS — D472 Monoclonal gammopathy: Secondary | ICD-10-CM

## 2023-07-26 ENCOUNTER — Other Ambulatory Visit: Payer: Self-pay

## 2023-07-26 ENCOUNTER — Inpatient Hospital Stay: Payer: Medicare HMO | Attending: Hematology

## 2023-07-26 DIAGNOSIS — D472 Monoclonal gammopathy: Secondary | ICD-10-CM | POA: Diagnosis not present

## 2023-07-26 LAB — CMP (CANCER CENTER ONLY)
ALT: 42 U/L (ref 0–44)
AST: 32 U/L (ref 15–41)
Albumin: 4.5 g/dL (ref 3.5–5.0)
Alkaline Phosphatase: 98 U/L (ref 38–126)
Anion gap: 8 (ref 5–15)
BUN: 24 mg/dL — ABNORMAL HIGH (ref 8–23)
CO2: 26 mmol/L (ref 22–32)
Calcium: 10.2 mg/dL (ref 8.9–10.3)
Chloride: 103 mmol/L (ref 98–111)
Creatinine: 1.25 mg/dL — ABNORMAL HIGH (ref 0.44–1.00)
GFR, Estimated: 47 mL/min — ABNORMAL LOW (ref >60)
Glucose, Bld: 93 mg/dL (ref 70–99)
Potassium: 4.4 mmol/L (ref 3.5–5.1)
Sodium: 137 mmol/L (ref 135–145)
Total Bilirubin: 0.4 mg/dL (ref 0.3–1.2)
Total Protein: 7.7 g/dL (ref 6.5–8.1)

## 2023-07-26 LAB — CBC WITH DIFFERENTIAL (CANCER CENTER ONLY)
Abs Immature Granulocytes: 0.01 10*3/uL (ref 0.00–0.07)
Basophils Absolute: 0 10*3/uL (ref 0.0–0.1)
Basophils Relative: 0 %
Eosinophils Absolute: 0 10*3/uL (ref 0.0–0.5)
Eosinophils Relative: 1 %
HCT: 39.4 % (ref 36.0–46.0)
Hemoglobin: 13.5 g/dL (ref 12.0–15.0)
Immature Granulocytes: 0 %
Lymphocytes Relative: 24 %
Lymphs Abs: 1.5 10*3/uL (ref 0.7–4.0)
MCH: 34.8 pg — ABNORMAL HIGH (ref 26.0–34.0)
MCHC: 34.3 g/dL (ref 30.0–36.0)
MCV: 101.5 fL — ABNORMAL HIGH (ref 80.0–100.0)
Monocytes Absolute: 0.3 10*3/uL (ref 0.1–1.0)
Monocytes Relative: 6 %
Neutro Abs: 4.3 10*3/uL (ref 1.7–7.7)
Neutrophils Relative %: 69 %
Platelet Count: 229 10*3/uL (ref 150–400)
RBC: 3.88 MIL/uL (ref 3.87–5.11)
RDW: 13.1 % (ref 11.5–15.5)
WBC Count: 6.2 10*3/uL (ref 4.0–10.5)
nRBC: 0 % (ref 0.0–0.2)

## 2023-07-26 LAB — LACTATE DEHYDROGENASE: LDH: 126 U/L (ref 98–192)

## 2023-07-27 LAB — KAPPA/LAMBDA LIGHT CHAINS
Kappa free light chain: 496.6 mg/L — ABNORMAL HIGH (ref 3.3–19.4)
Kappa, lambda light chain ratio: 85.62 — ABNORMAL HIGH (ref 0.26–1.65)
Lambda free light chains: 5.8 mg/L (ref 5.7–26.3)

## 2023-07-29 LAB — MULTIPLE MYELOMA PANEL, SERUM
Albumin SerPl Elph-Mcnc: 4.3 g/dL (ref 2.9–4.4)
Albumin/Glob SerPl: 1.5 (ref 0.7–1.7)
Alpha 1: 0.2 g/dL (ref 0.0–0.4)
Alpha2 Glob SerPl Elph-Mcnc: 0.7 g/dL (ref 0.4–1.0)
B-Globulin SerPl Elph-Mcnc: 1.9 g/dL — ABNORMAL HIGH (ref 0.7–1.3)
Gamma Glob SerPl Elph-Mcnc: 0.2 g/dL — ABNORMAL LOW (ref 0.4–1.8)
Globulin, Total: 3 g/dL (ref 2.2–3.9)
IgA: 40 mg/dL — ABNORMAL LOW (ref 87–352)
IgG (Immunoglobin G), Serum: 1136 mg/dL (ref 586–1602)
IgM (Immunoglobulin M), Srm: 31 mg/dL (ref 26–217)
M Protein SerPl Elph-Mcnc: 1.1 g/dL — ABNORMAL HIGH
Total Protein ELP: 7.3 g/dL (ref 6.0–8.5)

## 2023-08-01 ENCOUNTER — Inpatient Hospital Stay: Payer: Medicare HMO | Attending: Hematology | Admitting: Hematology

## 2023-08-01 DIAGNOSIS — D472 Monoclonal gammopathy: Secondary | ICD-10-CM | POA: Diagnosis not present

## 2023-08-01 DIAGNOSIS — R7989 Other specified abnormal findings of blood chemistry: Secondary | ICD-10-CM

## 2023-08-01 DIAGNOSIS — Z79899 Other long term (current) drug therapy: Secondary | ICD-10-CM | POA: Insufficient documentation

## 2023-08-01 NOTE — Progress Notes (Signed)
Hematology oncology outpatient phone visit note  DOS: 08/01/2023  Patient Care Team: Farris Has, MD as PCP - General (Family Medicine)  DIAGNOSIS:  smoldering myeloma  CHIEF COMPLIANT: continued evaluation and management of plasma cell dyscrasia.  HISTORY OF PRESENTING ILLNESS: Please see previous note for details on initial presentation  INTERVAL HISTORY:   Kiara Olson is a 67 y.o. female here for continued evaluation and management of smoldering myeloma.   I connected with Kiara Olson on 08/01/23 at  8:40 AM EST by telephone visit and verified that I am speaking with the correct person using two identifiers.   I last connected with the patient via phone on 04/12/2023 and she was doinf well overall.   Patient notes she has been doing well overall without any new or severe medical concerns since our last visit. She denies any new infection issues, fever, chills, night sweats, unexpected weight loss, back pain, chest pain, bone pain, abdominal pain, or leg swelling.  Patient has started working again.   Labs from 07/26/2023 were discussed in detail with the patient.   I discussed the limitations, risks, security and privacy concerns of performing an evaluation and management service by telemedicine and the availability of in-person appointments. I also discussed with the patient that there may be a patient responsible charge related to this service. The patient expressed understanding and agreed to proceed.   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     ALLERGIES:  is allergic to lidocaine.  MEDICATIONS:  Current Outpatient Medications  Medication Sig Dispense Refill   ARMOUR THYROID 30 MG tablet Take 30 mg by mouth every morning.     LORazepam (ATIVAN) 1 MG tablet Take 1.5 mg by mouth at  bedtime.     methocarbamol (ROBAXIN) 500 MG tablet Take 1,000 mg by mouth every 6 (six) hours.     OVER THE COUNTER MEDICATION Apply 1 application topically daily. Magnesium oil     oxyCODONE-acetaminophen (PERCOCET/ROXICET) 5-325 MG tablet Take 1 tablet by mouth every 6 (six) hours as needed for severe pain. For compression fracture in spine 60 tablet 0   Vitamin D-Vitamin K (VITAMIN K2-VITAMIN D3 PO) Place 2.5 drops under the tongue daily.     No current facility-administered medications for this visit.   REVIEW OF SYSTEMS:  10 Point review of Systems was done is negative except as noted above.   PHYSICAL EXAMINATION: TELEPHONE VISIT  LABORATORY DATA:  I have reviewed the data as listed.        Latest Ref Rng & Units 07/26/2023   12:57 PM 04/06/2023   12:55 PM 12/01/2022   12:55 PM  CMP  Glucose 70 - 99 mg/dL 93  84  89   BUN 8 - 23 mg/dL 24  21  13    Creatinine 0.44 - 1.00 mg/dL 1.60  1.09  3.23   Sodium 135 - 145 mmol/L 137  137  139   Potassium 3.5 - 5.1 mmol/L 4.4  4.1  4.0   Chloride 98 - 111 mmol/L 103  103  104   CO2 22 - 32 mmol/L 26  25  27    Calcium 8.9 - 10.3 mg/dL 55.7  32.2  02.5   Total Protein 6.5 - 8.1 g/dL 7.7  7.6  7.3   Total Bilirubin 0.3 - 1.2 mg/dL 0.4  0.5  0.4   Alkaline Phos 38 - 126 U/L 98  88  70   AST 15 - 41 U/L 32  24  15   ALT 0 - 44 U/L 42  22  16    .    Latest Ref Rng & Units 07/26/2023   12:57 PM 04/06/2023   12:55 PM 12/01/2022   12:55 PM  CBC  WBC 4.0 - 10.5 K/uL 6.2  6.0  4.4   Hemoglobin 12.0 - 15.0 g/dL 16.1  09.6  04.5   Hematocrit 36.0 - 46.0 % 39.4  37.3  36.0   Platelets 150 - 400 K/uL 229  233  213     ASSESSMENT & PLAN:   67 year old female with  #1 Smoldering myeloma  CBC shows normal blood counts with no anemia Previous CMP with normal renal function no hypercalcemia  Bone marrow biopsy 12/21/2021: Mildly hypercellular bone marrow 40 to 50% cellularity with 16% plasma cells kappa light chain restricted  (cytogenetics and FISH panel are pending)  #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  Plan: -Discussed lab results from 07/26/2023 in detail with the patient. CBC stable. CMP shows slightly elevated creatinine at 1.25, this could be due to dehydration.   -Discussed kappa/lambda light chain results from 07/26/2023 in detail with the patient. Showed elevated Kappa/Lambda light chain ration of 85.62. Elevated Kappa light chain from 459.9 to 496.6. -Discussed Multiple myeloma panel results from 07/26/2023 in detail. Showed stable M-spike of 1.1.  -Discussed with the patient to get referred to Nephrologist due to elevated creatinine and elevated Kappa light chain. Patient agrees.  -Refer patient to Nephrology.  -Recommend to drink at least 64 oz of water everyday.   FOLLOW-UP: -Referral to [facility] for nephrology consultation to Dr. Anthony Sar. -Phone visit with Dr Candise Che in 4 months Labs 1 week prior to phone visit  The total time spent in the appointment was 20 minutes* .  All of the patient's questions were answered with apparent satisfaction. The patient knows to call the clinic with any problems, questions or concerns.   Wyvonnia Lora MD MS AAHIVMS [facility] Hematology/Oncology Physician [facility]  .*Total Encounter Time as defined by the Centers for Medicare and Medicaid Services includes, in addition to the face-to-face time of a patient visit (documented in the note above) non-face-to-face time: obtaining and reviewing outside history, ordering and reviewing medications, tests or procedures, care coordination (communications with other health care professionals or caregivers) and documentation in the medical record.   I,Param Shah,acting as a Neurosurgeon for Wyvonnia Lora, MD.,have documented all relevant documentation on the behalf of Wyvonnia Lora, MD,as directed by   Wyvonnia Lora, MD while in the presence of Wyvonnia Lora, MD.  .I have reviewed the above documentation for accuracy and completeness, and I agree with the above. Johney Maine MD

## 2023-08-03 ENCOUNTER — Telehealth: Payer: Self-pay | Admitting: Hematology

## 2023-08-03 NOTE — Telephone Encounter (Signed)
Per Candise Che 11/4 los patient is aware of scheduled appointment times/dates for follow up

## 2023-09-13 DIAGNOSIS — C9000 Multiple myeloma not having achieved remission: Secondary | ICD-10-CM | POA: Diagnosis not present

## 2023-09-13 DIAGNOSIS — I700 Atherosclerosis of aorta: Secondary | ICD-10-CM | POA: Diagnosis not present

## 2023-09-13 DIAGNOSIS — E785 Hyperlipidemia, unspecified: Secondary | ICD-10-CM | POA: Diagnosis not present

## 2023-09-13 DIAGNOSIS — E039 Hypothyroidism, unspecified: Secondary | ICD-10-CM | POA: Diagnosis not present

## 2023-09-13 DIAGNOSIS — D472 Monoclonal gammopathy: Secondary | ICD-10-CM | POA: Diagnosis not present

## 2023-10-11 DIAGNOSIS — M549 Dorsalgia, unspecified: Secondary | ICD-10-CM | POA: Diagnosis not present

## 2023-10-11 DIAGNOSIS — M479 Spondylosis, unspecified: Secondary | ICD-10-CM | POA: Diagnosis not present

## 2023-10-11 DIAGNOSIS — M810 Age-related osteoporosis without current pathological fracture: Secondary | ICD-10-CM | POA: Diagnosis not present

## 2023-10-31 DIAGNOSIS — E039 Hypothyroidism, unspecified: Secondary | ICD-10-CM | POA: Diagnosis not present

## 2023-10-31 DIAGNOSIS — N1831 Chronic kidney disease, stage 3a: Secondary | ICD-10-CM | POA: Diagnosis not present

## 2023-10-31 DIAGNOSIS — D472 Monoclonal gammopathy: Secondary | ICD-10-CM | POA: Diagnosis not present

## 2023-10-31 DIAGNOSIS — G8929 Other chronic pain: Secondary | ICD-10-CM | POA: Diagnosis not present

## 2023-10-31 DIAGNOSIS — M549 Dorsalgia, unspecified: Secondary | ICD-10-CM | POA: Diagnosis not present

## 2023-11-01 ENCOUNTER — Encounter: Payer: Self-pay | Admitting: Internal Medicine

## 2023-11-25 ENCOUNTER — Other Ambulatory Visit: Payer: Self-pay

## 2023-11-25 DIAGNOSIS — D472 Monoclonal gammopathy: Secondary | ICD-10-CM

## 2023-11-28 ENCOUNTER — Other Ambulatory Visit: Payer: Self-pay

## 2023-11-28 ENCOUNTER — Inpatient Hospital Stay: Payer: Medicare HMO | Attending: Hematology

## 2023-11-28 DIAGNOSIS — D472 Monoclonal gammopathy: Secondary | ICD-10-CM | POA: Diagnosis not present

## 2023-11-28 DIAGNOSIS — Z79899 Other long term (current) drug therapy: Secondary | ICD-10-CM | POA: Diagnosis not present

## 2023-11-28 DIAGNOSIS — M8000XA Age-related osteoporosis with current pathological fracture, unspecified site, initial encounter for fracture: Secondary | ICD-10-CM | POA: Insufficient documentation

## 2023-11-28 DIAGNOSIS — M549 Dorsalgia, unspecified: Secondary | ICD-10-CM | POA: Diagnosis not present

## 2023-11-28 LAB — CMP (CANCER CENTER ONLY)
ALT: 38 U/L (ref 0–44)
AST: 29 U/L (ref 15–41)
Albumin: 4.1 g/dL (ref 3.5–5.0)
Alkaline Phosphatase: 77 U/L (ref 38–126)
Anion gap: 6 (ref 5–15)
BUN: 13 mg/dL (ref 8–23)
CO2: 28 mmol/L (ref 22–32)
Calcium: 9.4 mg/dL (ref 8.9–10.3)
Chloride: 104 mmol/L (ref 98–111)
Creatinine: 0.91 mg/dL (ref 0.44–1.00)
GFR, Estimated: >60 mL/min (ref >60)
Glucose, Bld: 88 mg/dL (ref 70–99)
Potassium: 4.2 mmol/L (ref 3.5–5.1)
Sodium: 138 mmol/L (ref 135–145)
Total Bilirubin: 0.3 mg/dL (ref 0.0–1.2)
Total Protein: 7 g/dL (ref 6.5–8.1)

## 2023-11-28 LAB — LACTATE DEHYDROGENASE: LDH: 119 U/L (ref 98–192)

## 2023-11-28 LAB — CBC WITH DIFFERENTIAL (CANCER CENTER ONLY)
Abs Immature Granulocytes: 0.01 10*3/uL (ref 0.00–0.07)
Basophils Absolute: 0 10*3/uL (ref 0.0–0.1)
Basophils Relative: 1 %
Eosinophils Absolute: 0.1 10*3/uL (ref 0.0–0.5)
Eosinophils Relative: 2 %
HCT: 36.7 % (ref 36.0–46.0)
Hemoglobin: 12.4 g/dL (ref 12.0–15.0)
Immature Granulocytes: 0 %
Lymphocytes Relative: 38 %
Lymphs Abs: 1.5 10*3/uL (ref 0.7–4.0)
MCH: 34.7 pg — ABNORMAL HIGH (ref 26.0–34.0)
MCHC: 33.8 g/dL (ref 30.0–36.0)
MCV: 102.8 fL — ABNORMAL HIGH (ref 80.0–100.0)
Monocytes Absolute: 0.3 10*3/uL (ref 0.1–1.0)
Monocytes Relative: 8 %
Neutro Abs: 2 10*3/uL (ref 1.7–7.7)
Neutrophils Relative %: 51 %
Platelet Count: 239 10*3/uL (ref 150–400)
RBC: 3.57 MIL/uL — ABNORMAL LOW (ref 3.87–5.11)
RDW: 12.5 % (ref 11.5–15.5)
WBC Count: 3.9 10*3/uL — ABNORMAL LOW (ref 4.0–10.5)
nRBC: 0 % (ref 0.0–0.2)

## 2023-11-29 LAB — KAPPA/LAMBDA LIGHT CHAINS
Kappa free light chain: 558 mg/L — ABNORMAL HIGH (ref 3.3–19.4)
Kappa, lambda light chain ratio: 67.23 — ABNORMAL HIGH (ref 0.26–1.65)
Lambda free light chains: 8.3 mg/L (ref 5.7–26.3)

## 2023-12-01 LAB — MULTIPLE MYELOMA PANEL, SERUM
Albumin SerPl Elph-Mcnc: 4.2 g/dL (ref 2.9–4.4)
Albumin/Glob SerPl: 1.7 (ref 0.7–1.7)
Alpha 1: 0.1 g/dL (ref 0.0–0.4)
Alpha2 Glob SerPl Elph-Mcnc: 0.7 g/dL (ref 0.4–1.0)
B-Globulin SerPl Elph-Mcnc: 1.7 g/dL — ABNORMAL HIGH (ref 0.7–1.3)
Gamma Glob SerPl Elph-Mcnc: 0.2 g/dL — ABNORMAL LOW (ref 0.4–1.8)
Globulin, Total: 2.6 g/dL (ref 2.2–3.9)
IgA: 33 mg/dL — ABNORMAL LOW (ref 87–352)
IgG (Immunoglobin G), Serum: 1099 mg/dL (ref 586–1602)
IgM (Immunoglobulin M), Srm: 24 mg/dL — ABNORMAL LOW (ref 26–217)
M Protein SerPl Elph-Mcnc: 1.2 g/dL — ABNORMAL HIGH
Total Protein ELP: 6.8 g/dL (ref 6.0–8.5)

## 2023-12-05 ENCOUNTER — Inpatient Hospital Stay (HOSPITAL_BASED_OUTPATIENT_CLINIC_OR_DEPARTMENT_OTHER): Payer: Medicare HMO | Admitting: Hematology

## 2023-12-05 DIAGNOSIS — Z79899 Other long term (current) drug therapy: Secondary | ICD-10-CM | POA: Diagnosis not present

## 2023-12-05 DIAGNOSIS — D472 Monoclonal gammopathy: Secondary | ICD-10-CM

## 2023-12-05 DIAGNOSIS — M549 Dorsalgia, unspecified: Secondary | ICD-10-CM | POA: Diagnosis not present

## 2023-12-05 DIAGNOSIS — M8000XA Age-related osteoporosis with current pathological fracture, unspecified site, initial encounter for fracture: Secondary | ICD-10-CM | POA: Diagnosis not present

## 2023-12-05 NOTE — Progress Notes (Signed)
 Hematology oncology outpatient phone visit note  DOS: 12/05/2023  Patient Care Team: Farris Has, MD as PCP - General (Family Medicine)  DIAGNOSIS:  smoldering myeloma  CHIEF COMPLIANT: continued evaluation and management of plasma cell dyscrasia.  HISTORY OF PRESENTING ILLNESS: Please see previous note for details on initial presentation  INTERVAL HISTORY:   Kiara Olson is a 68 y.o. female here for continued evaluation and management of smoldering myeloma.   I connected with Kiara Olson on 12/05/23 at  3:30 PM EDT by telephone visit and verified that I am speaking with the correct person using two identifiers.   I last connected with the patient via phone on 08/11/2023 and she was doinf well overall.   Patient notes she has been doing well overall since our last visit. She does complain of bilateral upper arm bone pain (mid-upper arm), worsens at night. She denies neck pain. Patient notes that she has been to her Orthopedic physician and kidney specialist, who wanted [place] to evaluate the pain.   She denies any new infection issues, fever, chills, night sweats, unexpected weight loss, back pain, chest pain, or leg swelling.   Patient notes she has chosen not to receive treatment for osteoporosis.   Discussed lab results from 11/28/2023 in detail with the patient.   I discussed the limitations, risks, security and privacy concerns of performing an evaluation and management service by telemedicine and the availability of in-person appointments. I also discussed with the patient that there may be a patient responsible charge related to this service. The patient expressed understanding and agreed to proceed.   Other persons participating in the visit and their role in the encounter: none   Patient's location: home  Provider's location: [facility]   Chief Complaint: continued evaluation and management of plasma cell dyscrasia.     ALLERGIES:  is allergic to  lidocaine.  MEDICATIONS:  Current Outpatient Medications  Medication Sig Dispense Refill   ARMOUR THYROID 30 MG tablet Take 30 mg by mouth every morning.     LORazepam (ATIVAN) 1 MG tablet Take 1.5 mg by mouth at bedtime.     methocarbamol (ROBAXIN) 500 MG tablet Take 1,000 mg by mouth every 6 (six) hours.     OVER THE COUNTER MEDICATION Apply 1 application topically daily. Magnesium oil     oxyCODONE-acetaminophen (PERCOCET/ROXICET) 5-325 MG tablet Take 1 tablet by mouth every 6 (six) hours as needed for severe pain. For compression fracture in spine 60 tablet 0   Vitamin D-Vitamin K (VITAMIN K2-VITAMIN D3 PO) Place 2.5 drops under the tongue daily.     No current facility-administered medications for this visit.   REVIEW OF SYSTEMS:  10 Point review of Systems was done is negative except as noted above.   PHYSICAL EXAMINATION: TELEPHONE VISIT  LABORATORY DATA:  I have reviewed the data as listed.        Latest Ref Rng & Units 11/28/2023    1:04 PM 07/26/2023   12:57 PM 04/06/2023   12:55 PM  CMP  Glucose 70 - 99 mg/dL 88  93  84   BUN 8 - 23 mg/dL 13  24  21    Creatinine 0.44 - 1.00 mg/dL 6.57  8.46  9.62   Sodium 135 - 145 mmol/L 138  137  137   Potassium 3.5 - 5.1 mmol/L 4.2  4.4  4.1   Chloride 98 - 111 mmol/L 104  103  103   CO2 22 - 32 mmol/L 28  26  25   Calcium 8.9 - 10.3 mg/dL 9.4  36.6  44.0   Total Protein 6.5 - 8.1 g/dL 7.0  7.7  7.6   Total Bilirubin 0.0 - 1.2 mg/dL 0.3  0.4  0.5   Alkaline Phos 38 - 126 U/L 77  98  88   AST 15 - 41 U/L 29  32  24   ALT 0 - 44 U/L 38  42  22    .    Latest Ref Rng & Units 11/28/2023    1:04 PM 07/26/2023   12:57 PM 04/06/2023   12:55 PM  CBC  WBC 4.0 - 10.5 K/uL 3.9  6.2  6.0   Hemoglobin 12.0 - 15.0 g/dL 34.7  42.5  95.6   Hematocrit 36.0 - 46.0 % 36.7  39.4  37.3   Platelets 150 - 400 K/uL 239  229  233     ASSESSMENT & PLAN:   68 year old female with  #1 Smoldering myeloma  CBC shows normal blood counts with  no anemia Previous CMP with normal renal function no hypercalcemia  Bone marrow biopsy 12/21/2021: Mildly hypercellular bone marrow 40 to 50% cellularity with 16% plasma cells kappa light chain restricted (cytogenetics and FISH panel are pending)  #2 Osteoporosis with multilevel compression fractures with back pain Chronic fracture evaluated by orthopedics-- vertebroplasty not recommended.  Plan: -Discussed lab results from 11/28/2023 in detail with the patient. CBC stable. CMP stable. LDH level of 119.  -Multiple myeloma panel results from 11/28/2023 showed elevated M-protein of 1.2. Kappa/Lambda light chain results shows elevated Kappa/Lambda light chain ratio of 67.23.  -Discussed the option of PET scan to evaluate upper bilateral arm pain. Pt declines PET scan right now.  -Recommend to follow-up with PCP regarding upper bilateral arm pain.  -Answered all of patient' questions.  -Educated pt about osteoporosis. Our recommendation is to start treatment with her primary physician to avoid fractures. Pt notes she current has declined to receive treatment.  -Recommend to drink at least 64 oz of water everyday.   FOLLOW-UP: Phone visit with Dr Candise Che in 4 months Labs 1 week prior to phone visit PET/CT if patient agreeable -- currently has declined this for evaluation of b/l arm pains   The total time spent in the appointment was 20 minutes* .  All of the patient's questions were answered with apparent satisfaction. The patient knows to call the clinic with any problems, questions or concerns.   Wyvonnia Lora MD MS AAHIVMS [facility] Hematology/Oncology Physician [facility]  .*Total Encounter Time as defined by the Centers for Medicare and Medicaid Services includes, in addition to the face-to-face time of a patient visit (documented in the note above) non-face-to-face time: obtaining and reviewing outside history, ordering and reviewing medications, tests or procedures, care  coordination (communications with other health care professionals or caregivers) and documentation in the medical record.   I,Param Shah,acting as a Neurosurgeon for Wyvonnia Lora, MD.,have documented all relevant documentation on the behalf of Wyvonnia Lora, MD,as directed by  Wyvonnia Lora, MD while in the presence of Wyvonnia Lora, MD.   .I have reviewed the above documentation for accuracy and completeness, and I agree with the above. Johney Maine MD

## 2023-12-06 ENCOUNTER — Telehealth: Payer: Self-pay | Admitting: Hematology

## 2023-12-06 NOTE — Telephone Encounter (Signed)
 Per scheduling orders on 12/05/2023 left patient a voicemail in regards to scheduled appointment times/dates; left callback number for scheduling line if patient is needing to cancel or change appointment

## 2024-01-24 DIAGNOSIS — E039 Hypothyroidism, unspecified: Secondary | ICD-10-CM | POA: Diagnosis not present

## 2024-01-24 DIAGNOSIS — G4700 Insomnia, unspecified: Secondary | ICD-10-CM | POA: Diagnosis not present

## 2024-01-24 DIAGNOSIS — E785 Hyperlipidemia, unspecified: Secondary | ICD-10-CM | POA: Diagnosis not present

## 2024-01-24 DIAGNOSIS — R0989 Other specified symptoms and signs involving the circulatory and respiratory systems: Secondary | ICD-10-CM | POA: Diagnosis not present

## 2024-01-24 DIAGNOSIS — D472 Monoclonal gammopathy: Secondary | ICD-10-CM | POA: Diagnosis not present

## 2024-01-24 DIAGNOSIS — Z0000 Encounter for general adult medical examination without abnormal findings: Secondary | ICD-10-CM | POA: Diagnosis not present

## 2024-04-02 ENCOUNTER — Other Ambulatory Visit: Payer: Self-pay

## 2024-04-02 DIAGNOSIS — D472 Monoclonal gammopathy: Secondary | ICD-10-CM

## 2024-04-03 ENCOUNTER — Inpatient Hospital Stay: Attending: Hematology

## 2024-04-06 ENCOUNTER — Telehealth: Admitting: Hematology

## 2024-04-08 NOTE — Progress Notes (Signed)
 SABRA

## 2024-04-09 ENCOUNTER — Inpatient Hospital Stay: Admitting: Hematology

## 2024-04-10 NOTE — Progress Notes (Signed)
 This encounter was created in error - please disregard.  This encounter was created in error - please disregard.

## 2024-04-17 DIAGNOSIS — E039 Hypothyroidism, unspecified: Secondary | ICD-10-CM | POA: Diagnosis not present

## 2024-05-01 ENCOUNTER — Telehealth: Payer: Self-pay | Admitting: Hematology

## 2024-05-02 DIAGNOSIS — Z0100 Encounter for examination of eyes and vision without abnormal findings: Secondary | ICD-10-CM | POA: Diagnosis not present

## 2024-05-02 DIAGNOSIS — H52223 Regular astigmatism, bilateral: Secondary | ICD-10-CM | POA: Diagnosis not present

## 2024-05-04 DIAGNOSIS — M549 Dorsalgia, unspecified: Secondary | ICD-10-CM | POA: Diagnosis not present

## 2024-05-04 DIAGNOSIS — M810 Age-related osteoporosis without current pathological fracture: Secondary | ICD-10-CM | POA: Diagnosis not present

## 2024-05-04 DIAGNOSIS — M47816 Spondylosis without myelopathy or radiculopathy, lumbar region: Secondary | ICD-10-CM | POA: Diagnosis not present

## 2024-05-07 ENCOUNTER — Inpatient Hospital Stay: Attending: Hematology

## 2024-05-07 DIAGNOSIS — Z79899 Other long term (current) drug therapy: Secondary | ICD-10-CM | POA: Insufficient documentation

## 2024-05-07 DIAGNOSIS — D472 Monoclonal gammopathy: Secondary | ICD-10-CM | POA: Insufficient documentation

## 2024-05-07 DIAGNOSIS — E039 Hypothyroidism, unspecified: Secondary | ICD-10-CM | POA: Insufficient documentation

## 2024-05-07 DIAGNOSIS — M8000XA Age-related osteoporosis with current pathological fracture, unspecified site, initial encounter for fracture: Secondary | ICD-10-CM | POA: Insufficient documentation

## 2024-05-07 DIAGNOSIS — R0989 Other specified symptoms and signs involving the circulatory and respiratory systems: Secondary | ICD-10-CM | POA: Diagnosis not present

## 2024-05-07 DIAGNOSIS — I6523 Occlusion and stenosis of bilateral carotid arteries: Secondary | ICD-10-CM | POA: Diagnosis not present

## 2024-05-07 LAB — CMP (CANCER CENTER ONLY)
ALT: 31 U/L (ref 0–44)
AST: 23 U/L (ref 15–41)
Albumin: 4.6 g/dL (ref 3.5–5.0)
Alkaline Phosphatase: 81 U/L (ref 38–126)
Anion gap: 6 (ref 5–15)
BUN: 15 mg/dL (ref 8–23)
CO2: 29 mmol/L (ref 22–32)
Calcium: 9.6 mg/dL (ref 8.9–10.3)
Chloride: 100 mmol/L (ref 98–111)
Creatinine: 0.93 mg/dL (ref 0.44–1.00)
GFR, Estimated: >60 mL/min (ref >60)
Glucose, Bld: 81 mg/dL (ref 70–99)
Potassium: 4.1 mmol/L (ref 3.5–5.1)
Sodium: 135 mmol/L (ref 135–145)
Total Bilirubin: 0.4 mg/dL (ref 0.0–1.2)
Total Protein: 7.8 g/dL (ref 6.5–8.1)

## 2024-05-07 LAB — CBC WITH DIFFERENTIAL (CANCER CENTER ONLY)
Abs Immature Granulocytes: 0.01 K/uL (ref 0.00–0.07)
Basophils Absolute: 0 K/uL (ref 0.0–0.1)
Basophils Relative: 1 %
Eosinophils Absolute: 0.1 K/uL (ref 0.0–0.5)
Eosinophils Relative: 1 %
HCT: 38.6 % (ref 36.0–46.0)
Hemoglobin: 13.3 g/dL (ref 12.0–15.0)
Immature Granulocytes: 0 %
Lymphocytes Relative: 36 %
Lymphs Abs: 1.7 K/uL (ref 0.7–4.0)
MCH: 34.4 pg — ABNORMAL HIGH (ref 26.0–34.0)
MCHC: 34.5 g/dL (ref 30.0–36.0)
MCV: 99.7 fL (ref 80.0–100.0)
Monocytes Absolute: 0.3 K/uL (ref 0.1–1.0)
Monocytes Relative: 7 %
Neutro Abs: 2.7 K/uL (ref 1.7–7.7)
Neutrophils Relative %: 55 %
Platelet Count: 252 K/uL (ref 150–400)
RBC: 3.87 MIL/uL (ref 3.87–5.11)
RDW: 12.9 % (ref 11.5–15.5)
WBC Count: 4.8 K/uL (ref 4.0–10.5)
nRBC: 0 % (ref 0.0–0.2)

## 2024-05-07 LAB — LACTATE DEHYDROGENASE: LDH: 117 U/L (ref 98–192)

## 2024-05-08 LAB — KAPPA/LAMBDA LIGHT CHAINS
Kappa free light chain: 578.5 mg/L — ABNORMAL HIGH (ref 3.3–19.4)
Kappa, lambda light chain ratio: 80.35 — ABNORMAL HIGH (ref 0.26–1.65)
Lambda free light chains: 7.2 mg/L (ref 5.7–26.3)

## 2024-05-09 LAB — MULTIPLE MYELOMA PANEL, SERUM
Albumin SerPl Elph-Mcnc: 4.2 g/dL (ref 2.9–4.4)
Albumin/Glob SerPl: 1.4 (ref 0.7–1.7)
Alpha 1: 0.1 g/dL (ref 0.0–0.4)
Alpha2 Glob SerPl Elph-Mcnc: 0.8 g/dL (ref 0.4–1.0)
B-Globulin SerPl Elph-Mcnc: 2 g/dL — ABNORMAL HIGH (ref 0.7–1.3)
Gamma Glob SerPl Elph-Mcnc: 0.2 g/dL — ABNORMAL LOW (ref 0.4–1.8)
Globulin, Total: 3.2 g/dL (ref 2.2–3.9)
IgA: 41 mg/dL — ABNORMAL LOW (ref 87–352)
IgG (Immunoglobin G), Serum: 1374 mg/dL (ref 586–1602)
IgM (Immunoglobulin M), Srm: 31 mg/dL (ref 26–217)
M Protein SerPl Elph-Mcnc: 1.5 g/dL — ABNORMAL HIGH
Total Protein ELP: 7.4 g/dL (ref 6.0–8.5)

## 2024-05-14 ENCOUNTER — Inpatient Hospital Stay (HOSPITAL_BASED_OUTPATIENT_CLINIC_OR_DEPARTMENT_OTHER): Admitting: Hematology

## 2024-05-14 DIAGNOSIS — E039 Hypothyroidism, unspecified: Secondary | ICD-10-CM | POA: Diagnosis not present

## 2024-05-14 DIAGNOSIS — Z79899 Other long term (current) drug therapy: Secondary | ICD-10-CM | POA: Diagnosis not present

## 2024-05-14 DIAGNOSIS — D472 Monoclonal gammopathy: Secondary | ICD-10-CM | POA: Diagnosis not present

## 2024-05-14 DIAGNOSIS — M8000XA Age-related osteoporosis with current pathological fracture, unspecified site, initial encounter for fracture: Secondary | ICD-10-CM | POA: Diagnosis not present

## 2024-05-20 NOTE — Progress Notes (Signed)
 Hematology oncology outpatient phone visit note  DOS: .05/14/2024   Patient Care Team: Kip Righter, MD as PCP - General (Family Medicine)  DIAGNOSIS:  smoldering myeloma  CHIEF COMPLIANT: Continued evaluation and management of plasma cell dyscrasia  HISTORY OF PRESENTING ILLNESS: Please see previous note for details on initial presentation  INTERVAL HISTORY:   Kiara Olson is a 68 y.o. female here for continued evaluation and management of smoldering myeloma.   I connected with Adrien GORMAN Croft on .05/14/2024 at  3:30 PM EDT by telephone visit and verified that I am speaking with the correct person using two identifiers.   I discussed the limitations, risks, security and privacy concerns of performing an evaluation and management service by telemedicine and the availability of in-person appointments. I also discussed with the patient that there may be a patient responsible charge related to this service. The patient expressed understanding and agreed to proceed.   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     Patient notes no acute new symptoms since her last clinic visit.  No significant acute new back pains.  No fevers no chills no night sweats.  No abnormal infection issues.  No abnormal bleeding or bruising.  No significant new fatigue. Recent labs done were discussed in detail with her.  ALLERGIES:  is allergic to lidocaine .  MEDICATIONS:  Current Outpatient Medications  Medication Sig Dispense Refill   ARMOUR THYROID  30 MG tablet Take 30 mg by mouth every morning.     LORazepam  (ATIVAN ) 1 MG tablet Take 1.5 mg by mouth at bedtime.     methocarbamol (ROBAXIN) 500 MG tablet Take 1,000 mg by mouth every 6 (six) hours.     OVER THE COUNTER MEDICATION Apply 1 application topically daily.  Magnesium oil     oxyCODONE -acetaminophen  (PERCOCET/ROXICET) 5-325 MG tablet Take 1 tablet by mouth every 6 (six) hours as needed for severe pain. For compression fracture in spine 60 tablet 0   Vitamin D -Vitamin K (VITAMIN K2-VITAMIN D3 PO) Place 2.5 drops under the tongue daily.     No current facility-administered medications for this visit.   REVIEW OF SYSTEMS:  10 Point review of Systems was done is negative except as noted above.   PHYSICAL EXAMINATION: TELEPHONE VISIT  LABORATORY DATA:  I have reviewed the data as listed.        Latest Ref Rng & Units 05/07/2024   12:27 PM 11/28/2023    1:04 PM 07/26/2023   12:57 PM  CMP  Glucose 70 - 99 mg/dL 81  88  93   BUN 8 - 23 mg/dL 15  13  24    Creatinine 0.44 - 1.00 mg/dL 9.06  9.08  8.74   Sodium 135 - 145 mmol/L 135  138  137   Potassium 3.5 - 5.1 mmol/L 4.1  4.2  4.4   Chloride 98 - 111 mmol/L 100  104  103   CO2 22 - 32 mmol/L 29  28  26    Calcium 8.9 - 10.3 mg/dL 9.6  9.4  89.7   Total Protein 6.5 - 8.1 g/dL 7.8  7.0  7.7   Total Bilirubin 0.0 - 1.2 mg/dL 0.4  0.3  0.4   Alkaline Phos 38 - 126 U/L 81  77  98   AST 15 - 41 U/L 23  29  32   ALT 0 - 44 U/L 31  38  42    .    Latest Ref Rng & Units 05/07/2024   12:27 PM 11/28/2023    1:04 PM 07/26/2023   12:57 PM  CBC  WBC 4.0 - 10.5 K/uL 4.8  3.9  6.2   Hemoglobin 12.0 - 15.0 g/dL 86.6  87.5  86.4   Hematocrit 36.0 - 46.0 % 38.6  36.7  39.4   Platelets 150 - 400 K/uL 252  239  229     ASSESSMENT & PLAN:   68 year old female with  #1 Smoldering myeloma  CBC shows normal blood counts with no anemia Previous CMP with normal renal function no hypercalcemia  Bone marrow biopsy 12/21/2021: Mildly hypercellular bone marrow 40 to 50% cellularity with 16% plasma cells kappa light chain restricted   #2 Osteoporosis with multilevel compression fractures with back pain Chronic fracture evaluated by orthopedics-- vertebroplasty not recommended. Patient has declined  osteoporosis treatment from our standpoint.  She was recommended to follow-up with her PCP regarding this and orthopedics.  #3 hypothyroidism on thyroid  replacement Plan: - Patient notes her back pain has fairly well-controlled.  No acute new back pains. - Labs from 05/07/2024 were reviewed with her in details CBC shows normal hemoglobin of 13.3 normal WBC count of 4.8k and normal platelet count of 252k CMP is within normal limits with normal creatinine and no hypercalcemia LDH within normal limits at 117 Myeloma panel shows stable M protein of 1.5 g/dL which is higher than her last lab of 1.2 but similar to her previous M protein of 1.4 g/dL in July 7976. Kappa lambda light chains show elevated kappa light chain of 578 with a kappa lambda ratio of 80. Patient has no clinical symptoms suggestive of myeloma progression. We discussed that she might meet criteria for high risk smoldering myeloma for which that is now dated to treat with daratumumab. We discussed that we could repeat the PET scan and bone marrow biopsy at this time but the patient wants to hold off and take a conservative approach. - We also discussed that if her light chain ratio exceeds 100 that would be considered active myeloma -Recommended she optimize osteoporosis management with PCP/orthopedics  FOLLOW-UP: Labs in 14 weeks Phone visit with Dr Onesimo in 16 weeks   .The total time spent in the appointment was 20 minutes* .  All of the patient's questions were answered with apparent satisfaction. The patient knows to call the clinic with any problems, questions or concerns.   Emaline Onesimo MD MS AAHIVMS [facility] Hematology/Oncology Physician [facility]  .*Total Encounter Time as defined by the Centers for Medicare and Medicaid Services includes, in addition to the face-to-face time of a patient visit (documented in the note above) non-face-to-face time: obtaining and reviewing outside history, ordering and  reviewing medications, tests or procedures, care coordination (communications with other health care professionals or caregivers) and documentation in the medical record.

## 2024-07-25 ENCOUNTER — Other Ambulatory Visit (HOSPITAL_BASED_OUTPATIENT_CLINIC_OR_DEPARTMENT_OTHER): Payer: Self-pay | Admitting: Family Medicine

## 2024-07-25 DIAGNOSIS — M810 Age-related osteoporosis without current pathological fracture: Secondary | ICD-10-CM

## 2024-08-17 ENCOUNTER — Other Ambulatory Visit: Payer: Self-pay

## 2024-08-17 DIAGNOSIS — D472 Monoclonal gammopathy: Secondary | ICD-10-CM

## 2024-08-20 ENCOUNTER — Inpatient Hospital Stay: Attending: Hematology

## 2024-08-20 DIAGNOSIS — D472 Monoclonal gammopathy: Secondary | ICD-10-CM | POA: Insufficient documentation

## 2024-08-20 LAB — CMP (CANCER CENTER ONLY)
ALT: 24 U/L (ref 0–44)
AST: 23 U/L (ref 15–41)
Albumin: 4.4 g/dL (ref 3.5–5.0)
Alkaline Phosphatase: 81 U/L (ref 38–126)
Anion gap: 10 (ref 5–15)
BUN: 14 mg/dL (ref 8–23)
CO2: 26 mmol/L (ref 22–32)
Calcium: 9.8 mg/dL (ref 8.9–10.3)
Chloride: 102 mmol/L (ref 98–111)
Creatinine: 0.94 mg/dL (ref 0.44–1.00)
GFR, Estimated: >60 mL/min (ref >60)
Glucose, Bld: 81 mg/dL (ref 70–99)
Potassium: 4.3 mmol/L (ref 3.5–5.1)
Sodium: 138 mmol/L (ref 135–145)
Total Bilirubin: 0.4 mg/dL (ref 0.0–1.2)
Total Protein: 7.4 g/dL (ref 6.5–8.1)

## 2024-08-20 LAB — CBC WITH DIFFERENTIAL (CANCER CENTER ONLY)
Abs Immature Granulocytes: 0.01 K/uL (ref 0.00–0.07)
Basophils Absolute: 0 K/uL (ref 0.0–0.1)
Basophils Relative: 1 %
Eosinophils Absolute: 0.1 K/uL (ref 0.0–0.5)
Eosinophils Relative: 2 %
HCT: 37 % (ref 36.0–46.0)
Hemoglobin: 12.7 g/dL (ref 12.0–15.0)
Immature Granulocytes: 0 %
Lymphocytes Relative: 43 %
Lymphs Abs: 2.1 K/uL (ref 0.7–4.0)
MCH: 34.5 pg — ABNORMAL HIGH (ref 26.0–34.0)
MCHC: 34.3 g/dL (ref 30.0–36.0)
MCV: 100.5 fL — ABNORMAL HIGH (ref 80.0–100.0)
Monocytes Absolute: 0.5 K/uL (ref 0.1–1.0)
Monocytes Relative: 9 %
Neutro Abs: 2.2 K/uL (ref 1.7–7.7)
Neutrophils Relative %: 45 %
Platelet Count: 229 K/uL (ref 150–400)
RBC: 3.68 MIL/uL — ABNORMAL LOW (ref 3.87–5.11)
RDW: 12.1 % (ref 11.5–15.5)
WBC Count: 5 K/uL (ref 4.0–10.5)
nRBC: 0 % (ref 0.0–0.2)

## 2024-08-20 LAB — LACTATE DEHYDROGENASE: LDH: 128 U/L (ref 105–235)

## 2024-08-21 LAB — KAPPA/LAMBDA LIGHT CHAINS
Kappa free light chain: 603.2 mg/L — ABNORMAL HIGH (ref 3.3–19.4)
Kappa, lambda light chain ratio: 64.86 — ABNORMAL HIGH (ref 0.26–1.65)
Lambda free light chains: 9.3 mg/L (ref 5.7–26.3)

## 2024-08-24 LAB — MULTIPLE MYELOMA PANEL, SERUM
Albumin SerPl Elph-Mcnc: 4.1 g/dL (ref 2.9–4.4)
Albumin/Glob SerPl: 1.6 (ref 0.7–1.7)
Alpha 1: 0.1 g/dL (ref 0.0–0.4)
Alpha2 Glob SerPl Elph-Mcnc: 0.7 g/dL (ref 0.4–1.0)
B-Globulin SerPl Elph-Mcnc: 1.8 g/dL — ABNORMAL HIGH (ref 0.7–1.3)
Gamma Glob SerPl Elph-Mcnc: 0.1 g/dL — ABNORMAL LOW (ref 0.4–1.8)
Globulin, Total: 2.7 g/dL (ref 2.2–3.9)
IgA: 35 mg/dL — ABNORMAL LOW (ref 87–352)
IgG (Immunoglobin G), Serum: 1230 mg/dL (ref 586–1602)
IgM (Immunoglobulin M), Srm: 23 mg/dL — ABNORMAL LOW (ref 26–217)
M Protein SerPl Elph-Mcnc: 1.5 g/dL — ABNORMAL HIGH
Total Protein ELP: 6.8 g/dL (ref 6.0–8.5)

## 2024-09-10 ENCOUNTER — Inpatient Hospital Stay: Attending: Hematology | Admitting: Hematology

## 2024-09-10 DIAGNOSIS — Z79890 Hormone replacement therapy (postmenopausal): Secondary | ICD-10-CM | POA: Insufficient documentation

## 2024-09-10 DIAGNOSIS — D472 Monoclonal gammopathy: Secondary | ICD-10-CM | POA: Diagnosis not present

## 2024-09-10 DIAGNOSIS — M549 Dorsalgia, unspecified: Secondary | ICD-10-CM | POA: Insufficient documentation

## 2024-09-10 DIAGNOSIS — E039 Hypothyroidism, unspecified: Secondary | ICD-10-CM | POA: Diagnosis not present

## 2024-09-10 DIAGNOSIS — M8000XA Age-related osteoporosis with current pathological fracture, unspecified site, initial encounter for fracture: Secondary | ICD-10-CM | POA: Diagnosis not present

## 2024-09-10 NOTE — Progress Notes (Signed)
 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    INTERVAL HISTORY: I connected with Kiara Olson on 09/10/2024 at  8:40 AM EST by telephone and verified that I am speaking with the correct person using two identifiers.   I discussed the limitations, risks, security and privacy concerns of performing an evaluation and management service by telemedicine and the availability of in-person appointments. I also discussed with the patient that there may be a patient responsible charge related to this service. The patient expressed understanding and agreed to proceed.   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  MEDICAL HISTORY Past Medical History:  Diagnosis Date   Alpha-1-antitrypsin deficiency (HCC)    Moderate level   Aortic atherosclerosis 2018   Diverticulitis 2018   GERD (gastroesophageal reflux disease)    Sleepers reflux   Hypothyroidism    Insomnia    Macrocytic anemia    Migraine    PONV (postoperative nausea and vomiting)    Seasonal allergies     SURGICAL HISTORY Past Surgical History:  Procedure Laterality Date   ABDOMINAL HYSTERECTOMY  1980   ABDOMINOPLASTY  1989   BREAST ENHANCEMENT SURGERY     COLONOSCOPY     X3    CYSTOSCOPY W/ URETERAL STENT PLACEMENT Bilateral 12/08/2018   Procedure: CYSTOSCOPY WITH BILATERAL RETROGRADE PYELOGRAM/BILATERAL URETERAL STENT PLACEMENT;  Surgeon: Alvaro Hummer, MD;  Location: [facility];  Service: Urology;  Laterality: Bilateral;   FLEXIBLE SIGMOIDOSCOPY N/A 12/08/2018   Procedure: FLEXIBLE SIGMOIDOSCOPY;  Surgeon: Teresa Lonni HERO, MD;  Location: [facility];  Service: General;  Laterality: N/A;   IR RADIOLOGIST EVAL & MGMT  02/12/2022   IR RADIOLOGIST EVAL & MGMT  02/19/2022    SOCIAL HISTORY Social History[1]  Social History   Social History Narrative   Not on file    SOCIAL DRIVERS OF HEALTH SDOH Screenings   Tobacco Use: Medium Risk (02/19/2022)     FAMILY HISTORY No family history on file.   ALLERGIES: is allergic to lidocaine .  MEDICATIONS  Current Outpatient Medications  Medication Sig Dispense Refill   ARMOUR THYROID  30 MG tablet Take 30 mg by mouth every morning.     methocarbamol (ROBAXIN) 500 MG tablet Take 1,000 mg by mouth every 6 (six) hours.     OVER THE COUNTER MEDICATION Apply 1 application topically daily. Magnesium oil     Vitamin D -Vitamin K (VITAMIN K2-VITAMIN D3 PO) Place 2.5 drops under the tongue daily.     No current facility-administered medications for this visit.    PHYSICAL EXAMINATION TELEPHONE VISIT:  GENERAL: sounds alert, in no acute distress and comfortable PSYCH: sounds alert & oriented x 3 with fluent speech  LABORATORY DATA:   I have reviewed the data as listed     Latest Ref Rng & Units 08/20/2024  12:09 PM 05/07/2024   12:27 PM 11/28/2023    1:04 PM  CBC EXTENDED  WBC 4.0 - 10.5 K/uL 5.0  4.8  3.9   RBC 3.87 - 5.11 MIL/uL 3.68  3.87  3.57   Hemoglobin 12.0 - 15.0 g/dL 87.2  86.6  87.5   HCT 36.0 - 46.0 % 37.0  38.6  36.7   Platelets 150 - 400 K/uL 229  252  239   NEUT# 1.7 - 7.7 K/uL 2.2  2.7  2.0   Lymph# 0.7 - 4.0 K/uL 2.1  1.7  1.5        Latest Ref Rng & Units 08/20/2024   12:09 PM 05/07/2024    12:27 PM 11/28/2023    1:04 PM  CMP  Glucose 70 - 99 mg/dL 81  81  88   BUN 8 - 23 mg/dL 14  15  13    Creatinine 0.44 - 1.00 mg/dL 9.05  9.06  9.08   Sodium 135 - 145 mmol/L 138  135  138   Potassium 3.5 - 5.1 mmol/L 4.3  4.1  4.2   Chloride 98 - 111 mmol/L 102  100  104   CO2 22 - 32 mmol/L 26  29  28    Calcium 8.9 - 10.3 mg/dL 9.8  9.6  9.4   Total Protein 6.5 - 8.1 g/dL 7.4  7.8  7.0   Total Bilirubin 0.0 - 1.2 mg/dL 0.4  0.4  0.3   Alkaline Phos 38 - 126 U/L 81  81  77   AST 15 - 41 U/L 23  23  29    ALT 0 - 44 U/L 24  31  38      RADIOGRAPHIC STUDIES: I have personally reviewed the radiological images as listed and agreed with the findings in the report. No results found.  ASSESSMENT & PLAN:  68 y.o. female with  #1 Smoldering myeloma   CBC shows normal blood counts with no anemia Previous CMP with normal renal function no hypercalcemia   Bone marrow biopsy 12/21/2021: Mildly hypercellular bone marrow 40 to 50% cellularity with 16% plasma cells kappa light chain restricted    #2 Osteoporosis with multilevel compression fractures with back pain Chronic fracture evaluated by orthopedics-- vertebroplasty not recommended. Patient has declined osteoporosis treatment from our standpoint.  She was recommended to follow-up with her PCP regarding this and orthopedics.   #3 hypothyroidism on thyroid  replacement  PLAN: - CMP is - CBC is -   FOLLOW-UP in {WEEKS/MONTHS:30939} for labs and follow-up with Dr. Onesimo.  The total time spent in the appointment was *** minutes* .  All of the patient's questions were answered and the patient knows to call the clinic with any problems, questions, or concerns.  Emaline Onesimo MD MS AAHIVMS [facility] Hematology/Oncology Physician [facility]  *Total Encounter Time as defined by the Centers for Medicare and Medicaid Services includes, in addition to the face-to-face time of a patient visit (documented in the note above)  non-face-to-face time: obtaining and reviewing outside history, ordering and reviewing medications, tests or procedures, care coordination (communications with other health care professionals or caregivers) and documentation in the medical record.  I, Marijo Sharps, acting as a neurosurgeon for Emaline Onesimo, MD.,have documented all relevant documentation on the behalf of Emaline Onesimo, MD,as directed by  Emaline Onesimo, MD while in the presence of Emaline Onesimo, MD.  I have reviewed the above documentation for accuracy and completeness, and I agree with the above.  Emaline Saran, MD     [1]  Social History Tobacco Use   Smoking status: Former    Current packs/day: 1.00    Average packs/day: 1 pack/day for 10.0 years (10.0 ttl pk-yrs)    Types: Cigarettes   Smokeless tobacco: Never  Vaping Use   Vaping status: Never Used  Substance Use Topics   Alcohol use: Yes    Comment: rare   Drug use: Never

## 2024-10-16 NOTE — Progress Notes (Signed)
 Kiara Olson                                          MRN: [id number]   10/16/2024   The [facility] Quality Team Specialist reviewed this patient medical record for the purposes of chart review for care gap closure. The following were reviewed: chart review for care gap closure-breast cancer screening.    [facility] Quality Team

## 2024-11-05 ENCOUNTER — Inpatient Hospital Stay

## 2024-11-19 ENCOUNTER — Inpatient Hospital Stay: Admitting: Hematology

## 2024-12-31 ENCOUNTER — Inpatient Hospital Stay

## 2025-01-14 ENCOUNTER — Inpatient Hospital Stay: Admitting: Hematology
